# Patient Record
Sex: Female | Born: 1955 | Race: White | Hispanic: No | Marital: Single | Smoking: Former smoker
Health system: Southern US, Community
[De-identification: ages and names within clinical notes are randomized; demographics above are authoritative.]

## PROBLEM LIST (undated history)

## (undated) DIAGNOSIS — I10 Essential (primary) hypertension: Secondary | ICD-10-CM

## (undated) DIAGNOSIS — J449 Chronic obstructive pulmonary disease, unspecified: Secondary | ICD-10-CM

## (undated) DIAGNOSIS — N289 Disorder of kidney and ureter, unspecified: Secondary | ICD-10-CM

## (undated) HISTORY — PX: CHOLECYSTECTOMY: SHX55

## (undated) HISTORY — PX: AV FISTULA PLACEMENT: SHX1204

---

## 2011-03-23 ENCOUNTER — Emergency Department: Payer: Self-pay | Admitting: Emergency Medicine

## 2011-03-24 ENCOUNTER — Emergency Department: Payer: Self-pay | Admitting: Unknown Physician Specialty

## 2011-04-12 ENCOUNTER — Other Ambulatory Visit: Payer: Self-pay | Admitting: Physician Assistant

## 2013-10-25 ENCOUNTER — Emergency Department: Payer: Self-pay | Admitting: Emergency Medicine

## 2013-10-25 LAB — CBC
HCT: 23.2 % — ABNORMAL LOW (ref 35.0–47.0)
HGB: 7.4 g/dL — ABNORMAL LOW (ref 12.0–16.0)
MCH: 31.7 pg (ref 26.0–34.0)
MCHC: 31.8 g/dL — ABNORMAL LOW (ref 32.0–36.0)
MCV: 100 fL (ref 80–100)
Platelet: 232 10*3/uL (ref 150–440)
RBC: 2.33 10*6/uL — ABNORMAL LOW (ref 3.80–5.20)
RDW: 19.5 % — ABNORMAL HIGH (ref 11.5–14.5)
WBC: 5.6 10*3/uL (ref 3.6–11.0)

## 2013-10-25 LAB — COMPREHENSIVE METABOLIC PANEL
ALK PHOS: 86 U/L
AST: 17 U/L (ref 15–37)
Albumin: 3.1 g/dL — ABNORMAL LOW (ref 3.4–5.0)
Anion Gap: 9 (ref 7–16)
BUN: 34 mg/dL — ABNORMAL HIGH (ref 7–18)
Bilirubin,Total: 0.3 mg/dL (ref 0.2–1.0)
CALCIUM: 8.3 mg/dL — AB (ref 8.5–10.1)
Chloride: 93 mmol/L — ABNORMAL LOW (ref 98–107)
Co2: 26 mmol/L (ref 21–32)
Creatinine: 5 mg/dL — ABNORMAL HIGH (ref 0.60–1.30)
EGFR (Non-African Amer.): 9 — ABNORMAL LOW
GFR CALC AF AMER: 10 — AB
GLUCOSE: 106 mg/dL — AB (ref 65–99)
Osmolality: 265 (ref 275–301)
Potassium: 4.7 mmol/L (ref 3.5–5.1)
SGPT (ALT): 10 U/L — ABNORMAL LOW
SODIUM: 128 mmol/L — AB (ref 136–145)
TOTAL PROTEIN: 6.3 g/dL — AB (ref 6.4–8.2)

## 2013-10-25 LAB — TROPONIN I: TROPONIN-I: 0.02 ng/mL

## 2013-10-25 LAB — D-DIMER(ARMC): D-Dimer: 3436 ng/ml

## 2013-10-31 ENCOUNTER — Emergency Department: Payer: Self-pay | Admitting: Emergency Medicine

## 2013-10-31 LAB — CBC
HCT: 22.7 % — AB (ref 35.0–47.0)
HGB: 7.3 g/dL — ABNORMAL LOW (ref 12.0–16.0)
MCH: 31.8 pg (ref 26.0–34.0)
MCHC: 32.3 g/dL (ref 32.0–36.0)
MCV: 98 fL (ref 80–100)
Platelet: 205 10*3/uL (ref 150–440)
RBC: 2.31 10*6/uL — ABNORMAL LOW (ref 3.80–5.20)
RDW: 18.2 % — ABNORMAL HIGH (ref 11.5–14.5)
WBC: 5 10*3/uL (ref 3.6–11.0)

## 2013-10-31 LAB — COMPREHENSIVE METABOLIC PANEL
ALK PHOS: 75 U/L
AST: 21 U/L (ref 15–37)
Albumin: 3.4 g/dL (ref 3.4–5.0)
Anion Gap: 7 (ref 7–16)
BILIRUBIN TOTAL: 0.2 mg/dL (ref 0.2–1.0)
BUN: 39 mg/dL — AB (ref 7–18)
CHLORIDE: 96 mmol/L — AB (ref 98–107)
Calcium, Total: 9.1 mg/dL (ref 8.5–10.1)
Co2: 33 mmol/L — ABNORMAL HIGH (ref 21–32)
Creatinine: 3.71 mg/dL — ABNORMAL HIGH (ref 0.60–1.30)
GFR CALC AF AMER: 15 — AB
GFR CALC NON AF AMER: 13 — AB
GLUCOSE: 93 mg/dL (ref 65–99)
OSMOLALITY: 281 (ref 275–301)
POTASSIUM: 4.5 mmol/L (ref 3.5–5.1)
SGPT (ALT): 12 U/L — ABNORMAL LOW
SODIUM: 136 mmol/L (ref 136–145)
Total Protein: 6.8 g/dL (ref 6.4–8.2)

## 2013-11-22 ENCOUNTER — Emergency Department: Payer: Self-pay | Admitting: Emergency Medicine

## 2013-11-22 LAB — BASIC METABOLIC PANEL
Anion Gap: 9 (ref 7–16)
BUN: 15 mg/dL (ref 7–18)
CHLORIDE: 98 mmol/L (ref 98–107)
Calcium, Total: 8.5 mg/dL (ref 8.5–10.1)
Co2: 31 mmol/L (ref 21–32)
Creatinine: 3.1 mg/dL — ABNORMAL HIGH (ref 0.60–1.30)
EGFR (African American): 18 — ABNORMAL LOW
GFR CALC NON AF AMER: 16 — AB
Glucose: 92 mg/dL (ref 65–99)
Osmolality: 276 (ref 275–301)
Potassium: 3.6 mmol/L (ref 3.5–5.1)
Sodium: 138 mmol/L (ref 136–145)

## 2013-11-22 LAB — CBC WITH DIFFERENTIAL/PLATELET
Basophil #: 0.1 10*3/uL (ref 0.0–0.1)
Basophil %: 1.1 %
Eosinophil #: 0.1 10*3/uL (ref 0.0–0.7)
Eosinophil %: 1.5 %
HCT: 30 % — ABNORMAL LOW (ref 35.0–47.0)
HGB: 10 g/dL — ABNORMAL LOW (ref 12.0–16.0)
LYMPHS ABS: 1.2 10*3/uL (ref 1.0–3.6)
LYMPHS PCT: 25.5 %
MCH: 32.5 pg (ref 26.0–34.0)
MCHC: 33.5 g/dL (ref 32.0–36.0)
MCV: 97 fL (ref 80–100)
Monocyte #: 0.7 x10 3/mm (ref 0.2–0.9)
Monocyte %: 14.7 %
NEUTROS ABS: 2.6 10*3/uL (ref 1.4–6.5)
Neutrophil %: 57.2 %
Platelet: 168 10*3/uL (ref 150–440)
RBC: 3.08 10*6/uL — ABNORMAL LOW (ref 3.80–5.20)
RDW: 16.7 % — AB (ref 11.5–14.5)
WBC: 4.6 10*3/uL (ref 3.6–11.0)

## 2013-11-22 LAB — TROPONIN I: Troponin-I: <0.02 ng/mL

## 2013-11-22 LAB — PRO B NATRIURETIC PEPTIDE: B-TYPE NATIURETIC PEPTID: 121106 pg/mL — AB (ref 0–125)

## 2013-11-26 ENCOUNTER — Inpatient Hospital Stay: Payer: Self-pay | Admitting: Internal Medicine

## 2013-11-26 LAB — CBC
HCT: 29.7 % — ABNORMAL LOW (ref 35.0–47.0)
HGB: 9.9 g/dL — ABNORMAL LOW (ref 12.0–16.0)
MCH: 32.8 pg (ref 26.0–34.0)
MCHC: 33.2 g/dL (ref 32.0–36.0)
MCV: 99 fL (ref 80–100)
Platelet: 185 10*3/uL (ref 150–440)
RBC: 3.01 10*6/uL — ABNORMAL LOW (ref 3.80–5.20)
RDW: 16.1 % — AB (ref 11.5–14.5)
WBC: 5.5 10*3/uL (ref 3.6–11.0)

## 2013-11-26 LAB — COMPREHENSIVE METABOLIC PANEL
ALK PHOS: 70 U/L
ANION GAP: 11 (ref 7–16)
AST: 25 U/L (ref 15–37)
Albumin: 3.8 g/dL (ref 3.4–5.0)
BILIRUBIN TOTAL: 0.5 mg/dL (ref 0.2–1.0)
BUN: 30 mg/dL — AB (ref 7–18)
CALCIUM: 9.2 mg/dL (ref 8.5–10.1)
CHLORIDE: 98 mmol/L (ref 98–107)
CO2: 27 mmol/L (ref 21–32)
CREATININE: 4.72 mg/dL — AB (ref 0.60–1.30)
EGFR (African American): 11 — ABNORMAL LOW
GFR CALC NON AF AMER: 9 — AB
Glucose: 100 mg/dL — ABNORMAL HIGH (ref 65–99)
OSMOLALITY: 278 (ref 275–301)
Potassium: 4.5 mmol/L (ref 3.5–5.1)
SGPT (ALT): 10 U/L — ABNORMAL LOW
SODIUM: 136 mmol/L (ref 136–145)
Total Protein: 7.1 g/dL (ref 6.4–8.2)

## 2013-11-26 LAB — TROPONIN I: Troponin-I: <0.02 ng/mL

## 2013-11-27 LAB — LIPID PANEL
Cholesterol: 119 mg/dL (ref 0–200)
HDL Cholesterol: 45 mg/dL (ref 40–60)
LDL CHOLESTEROL, CALC: 50 mg/dL (ref 0–100)
Triglycerides: 122 mg/dL (ref 0–200)
VLDL Cholesterol, Calc: 24 mg/dL (ref 5–40)

## 2013-11-27 LAB — CBC WITH DIFFERENTIAL/PLATELET
Basophil #: 0.1 10*3/uL (ref 0.0–0.1)
Basophil %: 1.3 %
EOS ABS: 0.2 10*3/uL (ref 0.0–0.7)
EOS PCT: 3.4 %
HCT: 28.2 % — ABNORMAL LOW (ref 35.0–47.0)
HGB: 9.2 g/dL — ABNORMAL LOW (ref 12.0–16.0)
Lymphocyte #: 1.2 10*3/uL (ref 1.0–3.6)
Lymphocyte %: 25.2 %
MCH: 32.2 pg (ref 26.0–34.0)
MCHC: 32.7 g/dL (ref 32.0–36.0)
MCV: 99 fL (ref 80–100)
MONO ABS: 0.4 x10 3/mm (ref 0.2–0.9)
Monocyte %: 8.7 %
Neutrophil #: 2.8 10*3/uL (ref 1.4–6.5)
Neutrophil %: 61.4 %
Platelet: 160 10*3/uL (ref 150–440)
RBC: 2.86 10*6/uL — ABNORMAL LOW (ref 3.80–5.20)
RDW: 15.8 % — AB (ref 11.5–14.5)
WBC: 4.6 10*3/uL (ref 3.6–11.0)

## 2013-11-27 LAB — COMPREHENSIVE METABOLIC PANEL
ALBUMIN: 3.6 g/dL (ref 3.4–5.0)
Alkaline Phosphatase: 67 U/L
Anion Gap: 10 (ref 7–16)
BUN: 39 mg/dL — AB (ref 7–18)
Bilirubin,Total: 0.5 mg/dL (ref 0.2–1.0)
CREATININE: 5.95 mg/dL — AB (ref 0.60–1.30)
Calcium, Total: 9 mg/dL (ref 8.5–10.1)
Chloride: 96 mmol/L — ABNORMAL LOW (ref 98–107)
Co2: 29 mmol/L (ref 21–32)
EGFR (Non-African Amer.): 7 — ABNORMAL LOW
GFR CALC AF AMER: 8 — AB
GLUCOSE: 97 mg/dL (ref 65–99)
Osmolality: 279 (ref 275–301)
Potassium: 4.4 mmol/L (ref 3.5–5.1)
SGOT(AST): 13 U/L — ABNORMAL LOW (ref 15–37)
SGPT (ALT): 8 U/L — ABNORMAL LOW
Sodium: 135 mmol/L — ABNORMAL LOW (ref 136–145)
Total Protein: 6.4 g/dL (ref 6.4–8.2)

## 2013-11-27 LAB — PHOSPHORUS: Phosphorus: 5 mg/dL — ABNORMAL HIGH (ref 2.5–4.9)

## 2013-11-27 LAB — PRO B NATRIURETIC PEPTIDE: B-TYPE NATIURETIC PEPTID: 154666 pg/mL — AB (ref 0–125)

## 2013-11-28 DIAGNOSIS — I359 Nonrheumatic aortic valve disorder, unspecified: Secondary | ICD-10-CM

## 2013-11-28 LAB — BODY FLUID CELL COUNT WITH DIFFERENTIAL
BASOS ABS: 1 %
Eosinophil: 0 %
Lymphocytes: 58 %
NUCLEATED CELL COUNT: 81 /mm3
Neutrophils: 7 %
OTHER CELLS BF: 3 %
OTHER MONONUCLEAR CELLS: 31 %

## 2013-11-28 LAB — PROTIME-INR
INR: 1
PROTHROMBIN TIME: 13.3 s (ref 11.5–14.7)

## 2013-11-28 LAB — GLUCOSE, SEROUS FLUID: GLUCOSE, BODY FLUID: 138 mg/dL

## 2013-11-28 LAB — PROTEIN, BODY FLUID: PROTEIN, BODY FLUID: 3.1 g/dL

## 2013-11-28 LAB — AMYLASE, BODY FLUID: AMYLASE, BODY FLUID: 37 U/L

## 2013-11-28 LAB — CHOLESTEROL, BODY FLUID

## 2013-11-28 LAB — LACTATE DEHYDROGENASE, PLEURAL OR PERITONEAL FLUID: LDH, BODY FLUID: 71 U/L

## 2013-11-29 LAB — PROTIME-INR
INR: 1.1
Prothrombin Time: 13.9 secs (ref 11.5–14.7)

## 2013-11-29 LAB — PHOSPHORUS: PHOSPHORUS: 4.3 mg/dL (ref 2.5–4.9)

## 2013-12-12 ENCOUNTER — Ambulatory Visit: Payer: Self-pay | Admitting: Cardiovascular Disease

## 2013-12-24 ENCOUNTER — Ambulatory Visit: Payer: Self-pay | Admitting: Internal Medicine

## 2014-01-14 ENCOUNTER — Ambulatory Visit: Payer: Self-pay | Admitting: Internal Medicine

## 2014-02-18 ENCOUNTER — Ambulatory Visit: Payer: Self-pay | Admitting: Vascular Surgery

## 2014-02-18 LAB — CBC WITH DIFFERENTIAL/PLATELET
BASOS ABS: 0 10*3/uL (ref 0.0–0.1)
Basophil %: 0.7 %
Eosinophil #: 0.1 10*3/uL (ref 0.0–0.7)
Eosinophil %: 1.3 %
HCT: 34.5 % — ABNORMAL LOW (ref 35.0–47.0)
HGB: 10.9 g/dL — ABNORMAL LOW (ref 12.0–16.0)
Lymphocyte #: 0.8 10*3/uL — ABNORMAL LOW (ref 1.0–3.6)
Lymphocyte %: 12.4 %
MCH: 29.4 pg (ref 26.0–34.0)
MCHC: 31.5 g/dL — ABNORMAL LOW (ref 32.0–36.0)
MCV: 94 fL (ref 80–100)
MONO ABS: 0.6 x10 3/mm (ref 0.2–0.9)
MONOS PCT: 9.4 %
Neutrophil #: 4.9 10*3/uL (ref 1.4–6.5)
Neutrophil %: 76.2 %
PLATELETS: 303 10*3/uL (ref 150–440)
RBC: 3.69 10*6/uL — AB (ref 3.80–5.20)
RDW: 18.3 % — ABNORMAL HIGH (ref 11.5–14.5)
WBC: 6.4 10*3/uL (ref 3.6–11.0)

## 2014-02-18 LAB — POTASSIUM: Potassium: 4.7 mmol/L (ref 3.5–5.1)

## 2014-02-18 LAB — APTT: ACTIVATED PTT: 37.2 s — AB (ref 23.6–35.9)

## 2014-02-18 LAB — PROTIME-INR
INR: 1.1
Prothrombin Time: 14.5 secs (ref 11.5–14.7)

## 2014-02-27 ENCOUNTER — Ambulatory Visit: Payer: Self-pay | Admitting: Vascular Surgery

## 2014-02-27 LAB — POTASSIUM: Potassium: 5.5 mmol/L — ABNORMAL HIGH (ref 3.5–5.1)

## 2014-05-08 ENCOUNTER — Ambulatory Visit: Payer: Self-pay | Admitting: Vascular Surgery

## 2014-05-15 ENCOUNTER — Ambulatory Visit: Payer: Self-pay | Admitting: Vascular Surgery

## 2014-06-14 ENCOUNTER — Emergency Department
Admit: 2014-06-14 | Disposition: A | Discharge status: Other Acute Inpatient Hospital | Payer: Medicare Other | Admitting: Emergency Medicine

## 2014-06-14 LAB — COMPREHENSIVE METABOLIC PANEL
ALBUMIN: 3.8 g/dL
ANION GAP: 12 (ref 7–16)
Alkaline Phosphatase: 78 U/L
BUN: 35 mg/dL — ABNORMAL HIGH
Bilirubin,Total: 0.6 mg/dL
CHLORIDE: 95 mmol/L — AB
CO2: 34 mmol/L — AB
Calcium, Total: 9.7 mg/dL
Creatinine: 4.09 mg/dL — ABNORMAL HIGH
EGFR (Non-African Amer.): 11 — ABNORMAL LOW
GFR CALC AF AMER: 13 — AB
GLUCOSE: 116 mg/dL — AB
Potassium: 5.4 mmol/L — ABNORMAL HIGH
SGOT(AST): 18 U/L
SGPT (ALT): 10 U/L — ABNORMAL LOW
SODIUM: 141 mmol/L
Total Protein: 7.3 g/dL

## 2014-06-14 LAB — TROPONIN I: Troponin-I: 0.03 ng/mL

## 2014-06-14 LAB — CBC
HCT: 26.7 % — ABNORMAL LOW (ref 35.0–47.0)
HGB: 8.5 g/dL — ABNORMAL LOW (ref 12.0–16.0)
MCH: 29 pg (ref 26.0–34.0)
MCHC: 31.9 g/dL — ABNORMAL LOW (ref 32.0–36.0)
MCV: 91 fL (ref 80–100)
PLATELETS: 244 10*3/uL (ref 150–440)
RBC: 2.93 10*6/uL — AB (ref 3.80–5.20)
RDW: 18.2 % — AB (ref 11.5–14.5)
WBC: 6.5 10*3/uL (ref 3.6–11.0)

## 2014-06-14 LAB — CK TOTAL AND CKMB (NOT AT ARMC)
CK, Total: 33 U/L — ABNORMAL LOW
CK-MB: 1 ng/mL

## 2014-06-15 ENCOUNTER — Ambulatory Visit (HOSPITAL_COMMUNITY)
Admission: EM | Admit: 2014-06-15 | Discharge: 2014-06-15 | Disposition: A | Discharge status: Home / Self Care | Payer: Medicare Other | Source: Other Acute Inpatient Hospital | Attending: Internal Medicine | Admitting: Internal Medicine

## 2014-06-15 DIAGNOSIS — I10 Essential (primary) hypertension: Secondary | ICD-10-CM | POA: Diagnosis present

## 2014-06-15 DIAGNOSIS — J9691 Respiratory failure, unspecified with hypoxia: Secondary | ICD-10-CM | POA: Insufficient documentation

## 2014-06-23 ENCOUNTER — Inpatient Hospital Stay
Admit: 2014-06-23 | Disposition: A | Discharge status: Home / Self Care | Payer: Self-pay | Attending: Internal Medicine | Admitting: Internal Medicine

## 2014-06-23 LAB — CBC WITH DIFFERENTIAL/PLATELET
BASOS ABS: 0.1 10*3/uL (ref 0.0–0.1)
Basophil %: 1 %
EOS PCT: 1.3 %
Eosinophil #: 0.1 10*3/uL (ref 0.0–0.7)
HCT: 26.5 % — ABNORMAL LOW (ref 35.0–47.0)
HGB: 8.3 g/dL — ABNORMAL LOW (ref 12.0–16.0)
LYMPHS ABS: 0.7 10*3/uL — AB (ref 1.0–3.6)
Lymphocyte %: 10.8 %
MCH: 29.7 pg (ref 26.0–34.0)
MCHC: 31.2 g/dL — ABNORMAL LOW (ref 32.0–36.0)
MCV: 95 fL (ref 80–100)
Monocyte #: 0.3 x10 3/mm (ref 0.2–0.9)
Monocyte %: 4.7 %
NEUTROS ABS: 5.6 10*3/uL (ref 1.4–6.5)
Neutrophil %: 82.2 %
PLATELETS: 246 10*3/uL (ref 150–440)
RBC: 2.79 10*6/uL — ABNORMAL LOW (ref 3.80–5.20)
RDW: 18.2 % — AB (ref 11.5–14.5)
WBC: 6.8 10*3/uL (ref 3.6–11.0)

## 2014-06-23 LAB — BASIC METABOLIC PANEL
Anion Gap: 14 (ref 7–16)
BUN: 74 mg/dL — ABNORMAL HIGH
CO2: 21 mmol/L — AB
CREATININE: 6.96 mg/dL — AB
Calcium, Total: 9.1 mg/dL
Chloride: 98 mmol/L — ABNORMAL LOW
EGFR (African American): 7 — ABNORMAL LOW
GFR CALC NON AF AMER: 6 — AB
GLUCOSE: 103 mg/dL — AB
POTASSIUM: 6.6 mmol/L — AB
SODIUM: 133 mmol/L — AB

## 2014-06-23 LAB — POTASSIUM: POTASSIUM: 5.4 mmol/L — AB

## 2014-06-23 LAB — PHOSPHORUS: Phosphorus: 5.7 mg/dL — ABNORMAL HIGH

## 2014-06-23 LAB — PRO B NATRIURETIC PEPTIDE: B-Type Natriuretic Peptide: 2725 pg/mL — ABNORMAL HIGH

## 2014-06-24 LAB — CBC WITH DIFFERENTIAL/PLATELET
Basophil #: 0.1 10*3/uL (ref 0.0–0.1)
Basophil %: 1.2 %
Eosinophil #: 0.1 10*3/uL (ref 0.0–0.7)
Eosinophil %: 2.4 %
HCT: 26.9 % — AB (ref 35.0–47.0)
HGB: 8.6 g/dL — AB (ref 12.0–16.0)
Lymphocyte #: 0.9 10*3/uL — ABNORMAL LOW (ref 1.0–3.6)
Lymphocyte %: 17.4 %
MCH: 29.1 pg (ref 26.0–34.0)
MCHC: 31.9 g/dL — AB (ref 32.0–36.0)
MCV: 91 fL (ref 80–100)
MONO ABS: 0.5 x10 3/mm (ref 0.2–0.9)
MONOS PCT: 10.5 %
Neutrophil #: 3.5 10*3/uL (ref 1.4–6.5)
Neutrophil %: 68.5 %
PLATELETS: 247 10*3/uL (ref 150–440)
RBC: 2.95 10*6/uL — AB (ref 3.80–5.20)
RDW: 17.8 % — ABNORMAL HIGH (ref 11.5–14.5)
WBC: 5.1 10*3/uL (ref 3.6–11.0)

## 2014-06-24 LAB — BASIC METABOLIC PANEL
Anion Gap: 10 (ref 7–16)
BUN: 35 mg/dL — ABNORMAL HIGH
CALCIUM: 8.4 mg/dL — AB
Chloride: 95 mmol/L — ABNORMAL LOW
Co2: 31 mmol/L
Creatinine: 3.78 mg/dL — ABNORMAL HIGH
EGFR (African American): 14 — ABNORMAL LOW
EGFR (Non-African Amer.): 12 — ABNORMAL LOW
GLUCOSE: 100 mg/dL — AB
POTASSIUM: 4.7 mmol/L
Sodium: 136 mmol/L

## 2014-06-26 ENCOUNTER — Ambulatory Visit
Admit: 2014-06-26 | Disposition: A | Discharge status: Home / Self Care | Payer: Self-pay | Attending: Vascular Surgery | Admitting: Vascular Surgery

## 2014-07-05 NOTE — H&P (Signed)
PATIENT NAME:  Katie Larsen, Katie Larsen MR#:  [id number] DATE OF BIRTH:  07-19-1955  DATE OF ADMISSION:  11/26/2013  PRIMARY CARE PHYSICIAN: Katie GarrisonStephen Kizer, MD  REFERRING PHYSICIAN: Daryel NovemberJonathan Williams, MD   CHIEF COMPLAINT: Shortness of breath and worsening of lower extremity swelling.   HISTORY OF PRESENT ILLNESS: The patient is a 59 year old pleasant Caucasian female with a past medical history of end-stage renal disease on hemodialysis on Monday, Wednesday and Friday, is presenting to the ED with a chief complaint of shortness of breath and worsening of lower extremity swelling since yesterday evening. The patient is compliant with her dialysis and her last dialysis was yesterday. The patient just recently had a cardiac arrest [place], on [place]th. The patient was [facility] and she was in the hospital for 2 weeks and she was subsequently sent over to the rehabilitation center. Recently, she was released from the rehabilitation and currently living with her dad. As the patient is significantly volume loaded, the ER physician called on-call nephrology, Dr. Thedore Larsen, and notified about the patient's fluid overload. He is aware of the patient's situation. The plan is to get hemodialysis done as soon as possible tomorrow morning. During my examination, the patient is in tears from low back pain and also rib pain. The patient is reporting that when she had cardiac arrest 1 month ago CPR was done and she had multiple risk factors. She is requesting for pain medicine. Dad is at bedside. No other complaints.   PAST MEDICAL HISTORY: End-stage renal disease on hemodialysis on Monday, Wednesday and Friday. Recent history of cardiac arrest, medically managed. Hypertension, anxiety.   PAST SURGICAL HISTORY: Left AV fistula.  ALLERGIES: No known drug allergies.   PSYCHOSOCIAL HISTORY: Lives at home with her father. Denies any history of smoking, alcohol or illicit drug usage.    FAMILY HISTORY: Hypertension runs in her family, also she is reporting cardiac problems.   REVIEW OF SYSTEMS: CONSTITUTIONAL: Denies any fever or fatigue.  EYES: Denies blurry vision, double vision, glaucoma.  EARS, NOSE AND THROAT: Denies epistaxis, discharge, snoring.  RESPIRATORY: Denies cough, COPD. CARDIOVASCULAR: Complaining of diffuse chest pain in the rib area from CPR, which was done recently during her cardiac arrest. Denies any syncope, palpitations.  GASTROINTESTINAL: No nausea, vomiting, diarrhea, abdominal pain.  GENITOURINARY: No dysuria, hematuria or renal calculi.  ENDOCRINE: Denies polyuria, nocturia, thyroid problems. GYNECOLOGICAL AND BREASTS: Denies breast mass or vaginal discharge.  Hematologic And Lymphatic: No anemia, easy bruising or bleeding.  INTEGUMENTARY: No acne, rash, lesions.  MUSCULOSKELETAL: Complaining of low back pain and pain in the chest from the recent rib fractures.  NEUROLOGICAL: Denies any vertigo, ataxia, dementia.  PSYCHIATRIC: No BPD or Schizophrenia  PHYSICAL EXAMINATION:  VITAL SIGNS: Temperature 98.2, pulse 84, respirations 16 to 18, blood pressure 172/95, pulse oximetry 94% on 2 liters, but during my examination her pulse oximetry dropped down to 91%, so oxygen increased to 3 liters and the patient saturations went back to 93% to 94%.  GENERAL APPEARANCE: Not in acute distress. Moderately built and thin-looking female, not in acute distress, but uncomfortable from back pain  HEENT: Normocephalic, atraumatic. Pupils are equally reacting to light and accommodation. No scleral icterus. No conjunctival injection. No sinus tenderness. No postnasal drip. Moist mucous membranes.  NECK: Supple. No JVD. Range of motion is intact. Trachea is midline.  LUNGS: Minimal rales and rhonchi are present bilaterally diffusely, but no accessory muscle usage. Reproducible anterior chest wall  tenderness, especially in the midsternal area.  GASTROINTESTINAL:  Soft. Bowel sounds are positive in all 4 quadrants. Nontender, nondistended. No hepatosplenomegaly. No masses felt.  NEUROLOGIC: Awake, alert, oriented x 3. Cranial nerves II-XII are grossly intact. Motor and sensory are intact. Reflexes are 2+.  EXTREMITIES: Pitting edema 3+ is present to the lower extremities, which is pitting in nature. No cyanosis. No clubbing. Examination of the back, no CVA tenderness.  PSYCHIATRIC: The patient is very anxiety and in tears from pain in the back and rib pain.   LABORATORIES AND IMAGING STUDIES: Chest x-ray, PA and lateral views: Process of volume overload with right greater than left-sided pleural effusion. Thoracic spine, AP and lateral view: No acute fracture or subluxation. Increased loculated fluid in the left major fissure. Bilateral small pleural effusions. Stable compression deformity is within the thoracic spine. Twelve-lead EKG: Normal sinus rhythm at 79 beats per minute with premature atrial contractions. Left atrial hypertrophy is present and the patient has a prolonged QT.   ASSESSMENT AND PLAN: A 59 year old pleasant Caucasian female with a history of end-stage renal disease on hemodialysis on Monday, Wednesday and Friday is coming to the ED with a chief complaint of shortness of breath as well as worsening of her lower extremity edema since yesterday evening. The patient is also complaining of pain in her back and in the ribs. The patient is compliant with her dialysis and her last dialysis was yesterday. ER physician has notified Dr. Thedore Mins, the on-call nephrology, regarding the patient's volume overload. He is aware and he is considering to do hemodialysis first thing in the morning.  1.  Acute respiratory distress from volume overload and pulmonary congestion. We will admit her to telemetry. We will consider giving a small dose of Lasix IV, as the patient still makes some urine and requesting to give her Lasix. We will continue oxygen via nasal cannula  and if necessary we will put her on BiPAP. Nephrology consult is placed to Dr. Thedore Mins and notified by Emergency Room physician regarding hemodialysis as soon as possible.  2.  Malignant hypertension, probably volume overload as well as from anxiety and pain. We will home medications and we will provide her hydralazine 5 mg IV as needed basis for systolic blood pressure greater than 165. 3.  End-stage renal disease. The patient gets hemodialysis on Monday, Wednesday and Friday. We will continue the same, but the patient might need dialysis as soon as possible regarding the volume overload regarding nephrologist consult.  4.  Recent history of cardiac arrest, status post cardiopulmonary resuscitation on July 30th, complaining of rib pain from the rib fractures from cardiopulmonary resuscitation. We will continue her home medication aspirin, beta blocker and statin and also will continue her home medications for pain and also will provide morphine IV as needed basis for severe pain. 5.  Anxiety. We will resume anxiolytics, which is from her home. 6.  We will provide gastrointestinal prophylaxis with Pepcid and deep veinous thrombosis prophylaxis with heparin subcutaneous.   CODE STATUS: She is full code. Dad is medical power of attorney.   Plan of care was discussed in detail with the patient and her dad at bedside. They both verbalized understanding of the plan.   TOTAL TIME SPENT: Fifty minutes.   ____________________________ Ramonita Lab, MD ag:TT D: 11/26/2013 18:02:50 ET T: 11/26/2013 18:59:31 ET JOB#: [id number]  cc: Ramonita Lab, MD, <Dictator> Mosetta Pigeon, MD Katie Garrison, MD  Ramonita Lab MD ELECTRONICALLY SIGNED 12/03/2013 13:34

## 2014-07-05 NOTE — Discharge Summary (Signed)
PATIENT NAME:  Katie Larsen, Katie Larsen MR#:  [id number] DATE OF BIRTH:  10-07-1955  DATE OF ADMISSION:  11/26/2013 DATE OF DISCHARGE:  11/29/2013  CHIEF COMPLAINT AT THE TIME OF ADMISSION: Shortness of breath and worsening of lower extremity swelling.   ADMITTING DIAGNOSES: 1.  Acute respiratory distress from volume overload and pulmonary congestion.  2.  Malignant hypertension.  3.  End-stage renal disease.  4.  Recent history of cardiac arrest status post cardiopulmonary resuscitation with rib fractures.  5.  Anxiety.   DISCHARGE DIAGNOSES: 1.  Acute hypoxic respiratory failure from underlying pleural effusion and chronic obstructive pulmonary disease.  2.  Acute on chronic systolic congestive heart failure with right greater than left-sided pleural effusion status post thoracocentesis and fluid extraction on 11/28/2013.  3.  End-stage renal disease, on hemodialysis.  4.  Accelerated hypertension.   SECONDARY DISCHARGE DIAGNOSES:  1.  History of coronary artery disease status post recent cardiac arrest.  2.  Chronic anterior chest wall tenderness from recent CPR during cardiac arrest which has lead to rib fractures.  CONSULTATIONS: Nephrology.  PROCEDURES: CT-guided thoracocentesis on September 17th.   HISTORY AND REASON FOR ADMISSION: The patient is a 59 year old Caucasian female who came into the ED with the chief complaint of worsening of shortness of breath and lower extremity edema. Please review my history and physical dictated on 11/26/2013. The patient was admitted to the hospital with the chief complaint of acute hypoxic respiratory failure from fluid overload and pulmonary congestion and mild malignant hypertension.   HOSPITAL COURSE: 1.  Acute respiratory distress from volume overload and pulmonary congestion resolved after hemodialysis. Nephrology was consulted who has recommended hemodialysis as soon as possible. While waiting for hemodialysis, as the patient makes urine, a small  dose of Lasix IV was given. Subsequently, the patient started feeling better. The chest x-ray has revealed pulmonary edema, greater on the right than on the left side. The patient stated that she had approximately 4 to 5 times of thoracentesis in the past for recurrent pleural effusion. As the patient was still having some baseline shortness of breath CT guided thoracentesis was done on 09/17 and fluid was collected and sent over for fluid analysis, which is pending at this time. Echocardiogram has revealed 60% to 65% of ejection fraction. Repeat chest x-ray did not reveal any pneumothorax following thoracentesis. The patient tolerated the procedure well and was clinically stable.  2.  For accelerated hypertension, the patient's home medications were continued, hemodialysis was continued and no new medications were added. The medication needs to be titrated by the primary care physician on an outpatient basis for better control of the blood pressure.  3.  Acute on chronic systolic congestive heart failure with right greater than left-sided pleural effusion. Thoracentesis was done. The decompensation seemed to be from accelerated hypertension. With better control of the blood pressure and hemodialysis her shortness of breath significantly improved. Lower extremity edema was also resolved. The patient started feeling better.   4.  The patient has acute hypoxic respiratory failure. Home oxygen was added as the patient was desaturating. The patient was saturating at 90 to 91% on 3 liters of oxygen with ambulation.  5.  The patient's chronic chest wall tenderness was assumed to be from recent CPR done during cardiac arrest which lead to rib fractures. The plan is to continue Percocet on as needed basis.   Hemodialysis was continued during the hospital course and the plan is to continue hemodialysis on Monday, Wednesday and Friday  as an outpatient nephrology with [facility] nephrology. The patient's clinical situation  significantly improved.   DISCHARGE INSTRUCTIONS: Case management was consulted regarding 3 liters of home O2 arrangements. The patient is to follow up with primary care physician in a week, [facility] nephrology, Dr. Wilford Corner, in a week. Continue hemodialysis on Monday, Wednesday and Friday. Follow up with cardiology, Dr. Jari Sportsman group.   Thoracentesis pleural fluid results are pending, which need to be followed up by the primary care physician.   The patient has to follow up with the CHF clinic.   ACTIVITY: As tolerated.   DIET: Renal diet.   DISPOSITION: Home with home oxygen.  DISCHARGE MEDICATIONS: Clonidine 0.1 mg 1 tablet p.o. 2 times a day, atorvastatin 80 mg p.o. once daily at bedtime, Coreg 12.5 mg 2 tablets p.o. 2 times a day, aspirin 81 mg once a day, hydralazine 100 mg p.o. every 8 hours, Keppra 500 mg 1/2 tablet p.o. once a day at bedtime, lorazepam 0.5 mg 1 tablet p.o. 3 times a day as needed for anxiety and nervousness, Tylenol 500 mg 2 tablets every 6 hours as needed for pain, Percocet 325/5 one tablet p.o. every 4 hours as needed for pain, amlodipine 10 mg once daily, furosemide 80 mg once daily, losartan 100 mg p.o. once a day.   The patient has to limit her fluid intake to less than 1500 mL. Plan of care was discussed in detail with the patient. She verbalized understanding of the plan.   TOTAL TIME SPENT ON THE DISCHARGE: 45 minutes.  ____________________________ Ramonita Lab, MD ag:sb D: 12/03/2013 13:50:04 ET T: 12/03/2013 14:17:28 ET JOB#: [id number]  cc: Ramonita Lab, MD, <Dictator> Ramonita Lab MD ELECTRONICALLY SIGNED 12/11/2013 16:23

## 2014-07-07 LAB — SURGICAL PATHOLOGY

## 2014-07-09 NOTE — Op Note (Signed)
PATIENT NAME:  Katie Larsen, Airis M MR#:  [id number] DATE OF BIRTH:  06/02/1955  DATE OF PROCEDURE:  02/27/2014  PREOPERATIVE DIAGNOSES:  1.  End-stage renal disease.  2.  Non-usable left brachiobasilic arteriovenous fistula.  3.  Hypertension.   POSTOPERATIVE DIAGNOSES:  1.  End-stage renal disease.  2.  Non-usable left brachiobasilic arteriovenous fistula.  3.  Hypertension.   PROCEDURES:  Revision/transposition of left brachiobasilic arteriovenous fistula.   SURGEON: Annice NeedyJason S. Calysta Craigo, MD   ANESTHESIA: General.   ESTIMATED BLOOD LOSS: 25 mL.   INDICATION FOR PROCEDURE: This is an individual with end-stage renal disease. She had a first stage of a brachiobasilic AV fistula performed at another hospital several months ago. This is not in a usable location and has not been transposed. We are trying to revise this to make this a useful fistula so that she can begin dialysis with that and remove her catheter. Risks and benefits were discussed. Informed consent was obtained.   DESCRIPTION OF THE PROCEDURE: The patient is brought to the operative suite and after an adequate level of general anesthesia was obtained, the left upper extremity was sterilely prepped and draped and a sterile surgical field was created. An incision was made overlying the previous incision in the antecubital fossa and the basilic vein was dissected out. This was dissected down to its anastomosis to the artery and then the incision was gradually elongated up to the axilla to where the basilic vein emptied into the axillary vein. There were a large number of branches that were ligated and divided between silk ties, and any large branches were doubly ligated. The vein was marked for orientation. I then used a large curved Kelly to tunnel from the antecubital spot to the axillary spot and bring the vein. The vein was then transected after being clamped at the anastomosis with a vascular clamp, and the patient heparinized. It was brought  in through the tunnel. The vein was actually somewhat redundant and the area that the vein was previously transected was somewhat sclerotic and stenotic. I used dilators and was able to get up to a 4 dilator, but the vein really would not dilate beyond this. As we had a little extra length on the vein, I transected about 1 cm to 1.5 cm of vein, bringing this to a more pliable and soft portion of the vein where a 5 dilator easily passed. The anastomosis was then created in an end-to-end fashion with two 6-0 Prolene sutures in a parachuting fashion. The vessel was flushed and deaired prior to releasing control. On release, there was excellent thrill within the AV fistula. The wound was irrigated. Surgicel and Evicel topical hemostatic agents were placed. Hemostasis was complete. The incision was closed with a series of interrupted 3-0 Vicryl sutures. A 3-0 Vicryl running suture was used to close the subcutaneous space and a 4-0 Monocryl was used to close the skin. Dermabond was placed as a dressing. The patient was awakened from anesthesia and taken to the recovery room in stable condition, having tolerated the procedure well.   ____________________________ Annice NeedyJason S. Javid Kemler, MD jsd:ST D: 02/27/2014 15:02:20 ET T: 02/28/2014 01:51:44 ET JOB#: [id number]  cc: Annice NeedyJason S. Lydie Stammen, MD, <Dictator> Annice NeedyJASON S Eulamae Greenstein MD ELECTRONICALLY SIGNED 03/17/2014 14:10

## 2014-07-13 NOTE — H&P (Signed)
[name], Katie Larsen MR#:  [id number] DATE OF BIRTH:  1956-01-25  DATE OF ADMISSION:  06/23/2014  REFERRING EMERGENCY ROOM Larsen: Katie Breeze, MD   Katie Larsen:  Katie Larsen: Shortness of breath.   HISTORY OF PRESENT ILLNESS: Katie very pleasant 59 year old Larsen with past medical history of end-stage renal disease on hemodialysis, coronary artery disease, diastolic dysfunction, history of cardiac arrest in July 2015j, chronic respiratory failure on 2-3 liters of nasal cannula at home, presents today with acute worsening of shortness of breath. Katie Larsen reports that Katie Larsen symptoms started abruptly last night when Katie Larsen laid down to go to bed. Katie Larsen found that Katie Larsen could not breathe. Katie Larsen is usually sleeps on 2 pillows, but was unable to get comfortable last night and had to sleep sitting up. Katie Larsen denies any fevers or chills. No nausea or vomiting, no sputum production or hemoptysis. Katie Larsen was supposed to have hemodialysis today, but Katie Larsen came to [facility] via EMS instead, due to shortness of breath. Katie Larsen has complains of a left rib and upper abdominal pain, which has been present for approximately 1 year. [name], Katie Larsen was found to be hyperkalemic with a potassium of 6.6. Chest x-ray showing interstitial edema, suggestive of volume overload.   PAST MEDICAL HISTORY: 1. End-stage renal disease on hemodialysis on Monday, Wednesdays, and Fridays for Katie past 1 year.  2. Hypertension.  3. Anxiety.  4. Coronary artery disease, status post cardiac arrest in July 2015.  5. Diastolic dysfunction seen on 2-D echocardiogram, September 2015.  6. Pulmonary hypertension, echocardiogram, September 2015.  7. Multiple thoracic compression fractures seen on recent imaging.  8. Chronic respiratory failure, on 2-3 liters of oxygen at home.  9. Emphysematous changes on imaging, probable COPD.   SOCIAL HISTORY: Katie Larsen lives with Katie Larsen father, who is ill and  does not help with Katie Larsen care. Katie Larsen does not use a cane or a walker. Katie Larsen uses 2-3 liters of nasal cannula daily. Katie Larsen reports that Katie Larsen is a former smoker; but was never a heavy smoker. Denies any alcohol or illicit substance abuse.   FAMILY MEDICAL HISTORY: Positive for hypertension and coronary artery disease.   REVIEW OF SYSTEMS: CONSTITUTIONAL: No fever, fatigue, or change in weight.  HEENT: No change in vision or hearing. No pain in Katie eyes or ears. No sinus congestion or difficulty swallowing.  RESPIRATORY: Positive for shortness of breath with exertion, painful respiration, probable COPD, no recent coughing or wheezing.  CARDIOVASCULAR: No chest pain or edema, Katie Larsen does have orthopnea, no palpitations or syncope.  GASTROINTESTINAL: No nausea, vomiting, diarrhea, abdominal pain, or change in bowel habits.  GENITOURINARY: No dysuria or frequency.  ENDOCRINE: No polyuria, polydipsia, hot or cold intolerance.  HEMATOLOGIC: No easy bruising, bleeding, or swollen lymph glands.  SKIN: No recent changes. No rash, lesions, or open wounds.  MUSCULOSKELETAL: No new pain in Katie neck, back, shoulders, knees, or hips. Katie Larsen has a chronic back pain and also, for Katie past year, has had pain in a band sensation across Katie ribs, worse on Katie left side.  NEUROLOGIC: No focal numbness or weakness. No history of CVA, dementia or seizure.  PSYCHIATRIC: No bipolar disorder or schizophrenia, positive for anxiety.   PAST SURGICAL HISTORY: Left AV fistula placement.   HOME MEDICATIONS: 1. Tramadol 50 mg 1 tablet every 4 hours as needed for pain.  2. PhosLo 667 mg 3 capsules 3 times a day with meals, 1 capsule twice a  day with snacks. 3. Lisinopril 40 mg 1/2 tablet once a day.  4. Hydroxyzine 25 mg 1 tablet 4 times a day as needed for itching.  5. Hydralazine 100 mg 1 tablet twice a day.  6. Carvedilol 25 mg 1 tablet twice a day.  7. Aspirin 81 mg daily.   ALLERGIES: HYDROCODONE CAUSES ITCHING.   PHYSICAL  EXAMINATION: VITAL SIGNS: Temperature 92.2, pulse 67, respirations 20, blood pressure 193/90, oxygenation 98% on 3.5 liters nasal cannula.  GENERAL: Katie Larsen is anxious and dyspneic sitting forward.  HEENT: Pupils equal, round, and reactive to light. Conjunctivae clear, extraocular motion intact. Oral mucous membranes are pink and moist. Posterior oropharynx is clear. No exudate, edema, or erythema.  NECK: Supple, trachea midline. No cervical lymphadenopathy.  RESPIRATORY: Katie Larsen is taking short shallow respirations Katie Larsen has bibasilar crackles and decreased breath sounds on Katie right base. No wheezing or rhonchi. No coughing during examination.  CARDIOVASCULAR: Regular rate and rhythm. There is a 3/6 systolic ejection murmur. No peripheral edema. Peripheral pulses 1+. Katie Larsen does have JVD. No carotid bruit.  ABDOMEN: Soft, nondistended. There is mild tenderness in Katie epigastric area. No guarding, no rebound.  MUSCULOSKELETAL: No joint effusions. Range of motion normal. Strength and tone equal bilaterally and stable.  SKIN: No skin rashes, lesions, or open wounds; particularly, no skin changes over Katie left ribs or upper abdomen.  NEUROLOGIC: Cranial nerves II through XII grossly intact. Strength and sensation intact.  PSYCHIATRIC: Katie Larsen is very anxious. Katie Larsen is jittery, moving around in Katie bed. Katie Larsen is alert and oriented and does seem to have good insight into Katie Larsen.   LABORATORY DATA: Sodium 133, potassium 6.6, chloride 98, bicarbonate 21, BUN 74, creatinine 6.96, glucose 103, calcium 9.1. White blood cells 6.8, hemoglobin 8.3, platelets 246,000. MCV is 95.   IMAGING: Chest x-ray shows persistent congestive heart failure superimposed on emphysematous change. There is a partially loculated effusion on Katie right with atelectatic changes in Katie right base, which is stable.   ASSESSMENT AND PLAN: 1. Acute on chronic respiratory failure: Katie is likely due to volume overload in Katie setting of  dietary noncompliance in a hemodialysis Larsen. Katie Larsen does have signs of congestive heart failure exacerbation due to diastolic dysfunction volume overload on Katie Larsen chest x-ray. Katie Larsen will have dialysis today to reduce Katie Larsen volume load. I am also concerned about Katie Larsen potential emphysematous changes and Katie stable loculated effusion, which was evaluated during Katie last admission. Katie Larsen has been persistently hypoxic since that admission when home oxygen was started in September 2015. I will also ask for a pulmonology consult. We will start scheduled nebulizer treatments. Reassess after hemodialysis.  2. Hyperkalemia with EKG changes of T waves: Katie Larsen will have hemodialysis today. Katie Larsen has already received calcium gluconate, insulin, and Lasix in [facility]. I have discussed with nephrology, and they are in favor of continuing lisinopril. 3. Anxiety: Katie Larsen is very anxious at Katie time, likely due to hypoxia, but also probably with some chronic anxiety disorder. Would suggest starting an selective serotonin reuptake inhibitor for Katie Larsen. Katie Larsen likely also needs counseling and support. I will order a social work consult.  4. Abdominal pain: Katie Larsen reports that Katie Larsen has had CT scans and further evaluation for Katie pain. Katie Larsen has reports that Katie Larsen has multiple thoracic compression fractions, which I can see on radiology, as well. I suspect that Katie Larsen has radiculopathy from these compression fractures. Katie Larsen may need to discuss with Katie Larsen Katie Larsen, referral to a  spine center for contractions, or other treatment for Katie radiculopathy.  5. Katie Larsen is a DO NOT RESUSCITATE. I discussed Katie with Katie Larsen. Katie Larsen has been through cardiopulmonary resuscitation and states that Katie Larsen would never want to go through that again. I have placed Katie disorder in Katie Larsen chart.   TIME SPENT ON ADMISSION: 45 minutes.     ____________________________ Ena Dawleyatherine P. Clent RidgesWalsh, MD cpw:mw D: 06/23/2014 17:05:14 ET T: 06/23/2014  17:37:47 ET JOB#: [id number]  cc: Santina Evansatherine P. Clent RidgesWalsh, MD, <Dictator> Gale JourneyATHERINE P WALSH MD ELECTRONICALLY SIGNED 07/01/2014 15:08

## 2014-07-13 NOTE — Discharge Summary (Signed)
PATIENT NAME:  Katie Larsen, Katie Larsen MR#:  [id number] DATE OF BIRTH:  1955-04-03  DATE OF ADMISSION:  06/23/2014 DATE OF DISCHARGE:  06/24/2014  For a detailed note, please take a look at the history and physical done on admission by Dr. Ena Dawleyatherine P. Clent RidgesWalsh, MD.   DIAGNOSES AT DISCHARGE: Is as follows: 1. Acute respiratory failure with hypoxia secondary to volume overload and congestive heart failure.  2. Congestive heart failure.  3. Acute and chronic diastolic dysfunction.  4. End-stage renal disease on hemodialysis.  5. Hyperkalemia.  6. Hypertension.  7. Secondary hyperparathyroidism.  8. Chronic obstructive pulmonary disease with a loculated pleural effusion.   DIET:   The patient is being discharged home on a low sodium renal diet.   ACTIVITY: As tolerated.   FOLLOW-UP:  Dr. Mellody DanceKizer in the next 1-2 weeks.  Also, follow up with Dr. Meredeth IdeFleming in the next 1-2 weeks.   DISCHARGE MEDICATIONS: Are as follows: Coreg 25 mg b.i.d., lisinopril 40 mg 1/2 tablet daily, hydralazine 100 mg b.i.d., aspirin 81 mg daily, PhosLo 667 mg 1 tab b.i.d., tramadol 50 mg every 4 hours as needed, hydroxyzine 25 mg 4 times daily as needed PhosLo 3 tabs t.i.d. with meals.   CONSULTANTS DURING THE HOSPITAL COURSE: Lennox PippinsMunsoor N. Lateef, MD, from nephrology.   PERTINENT STUDIES DONE DURING HOSPITAL COURSE: A chest x-ray done on April 11 showing persistent CHF superimposed on emphysema, partially loculated effusion on the right with atelectatic change in the right base.   HOSPITAL COURSE: This is a 59 year old female with medical problems as mentioned above, presented to the hospital with shortness of breath and noted to be in acute respiratory failure.  1. Acute respiratory failure with hypoxia. This was likely secondary to volume overload and congestive heart failure. The patient underwent hemodialysis urgently given her history of end-stage renal disease and her symptoms have significantly improved postdialysis. The patient  also has underlying history of COPD and is already on oxygen at home. After being dialyzed; however, clinical symptoms and hypoxia have significantly improved and, therefore, she is being discharged home.  2. Hyperkalemia. This was likely secondary to the end-stage renal disease. The patient was dialyzed emergently and her potassium has since then improved and normalized. She had no evidence of any acute arrhythmia on telemetry.  3. End-stage renal disease on hemodialysis. The [facility]. She will maintain  her schedule of Monday, Wednesday, Friday dialysis.  4. COPD with a loculated pleural effusion. The patient is already on oxygen at home.  She did not have any evidence of pleurisy or any fever or any elevated white cell count to suggest that the effusion was a pneumonic or parapneumonic effusion. I did set up an appointment with Dr. Meredeth IdeFleming for her to see as an outpatient.  5. Secondary hyperparathyroidism.  The patient was maintained on her calcium acetate.   She will  resume that.  6. Hypertension. The patient was maintained on her Coreg, hydralazine, and lisinopril and she will resume that upon discharge.   CODE STATUS: THE PATIENT IS A FULL CODE.   TIME SPENT ON DISCHARGE: Is 35 minutes.    ____________________________ Rolly PancakeVivek J. Cherlynn KaiserSainani, MD vjs:tr D: 06/24/2014 16:02:14 ET T: 06/24/2014 21:30:22 ET JOB#: [id number]  cc: Rolly PancakeVivek J. Cherlynn KaiserSainani, MD, <Dictator> Herbon E. Meredeth IdeFleming, MD Dr. Kandy GarrisonStephen Kizer    Houston SirenVIVEK J SAINANI MD ELECTRONICALLY SIGNED 07/02/2014 11:28

## 2014-07-13 NOTE — Op Note (Signed)
PATIENT NAME:  Katie Larsen, Katie Larsen MR#:  [id number] DATE OF BIRTH:  Aug 23, 1955  DATE OF PROCEDURE:  06/26/2014  PREOPERATIVE DIAGNOSES:  1.  End-stage renal disease.  2.  Slowly maturing left arm arteriovenous fistula.  3.  Prolonged bleeding with dialysis.  4.  Hypertension.   POSTOPERATIVE DIAGNOSES:  1.  End-stage renal disease.  2.  Slowly maturing left arm arteriovenous fistula.  3.  Prolonged bleeding with dialysis.  4.  Hypertension.   PROCEDURES:  1.  Ultrasound guidance for vascular access to left arm arteriovenous fistula.  2.  Left upper extremity fistulogram and central venogram.   SURGEON: Annice NeedyJason S Cathi Hazan, MD   ANESTHESIA: Local with moderate conscious sedation.   ESTIMATED BLOOD LOSS: Minimal.   FLUOROSCOPY TIME: Less than 1 minute at 15 mL, contrasts were used.   INDICATION FOR PROCEDURE: A 59 year old female with end-stage renal disease. She has been catheter dependent for about a year. She had a brachiobasilic AV fistula performed at outside institution which I transposed about 4 months ago.  It had just been starting to be used in the last few weeks and has had prolonged bleeding.  It was a slow to mature.  A previous noninvasive study had suggested stenosis of the arterial anastomosis several weeks ago. She is brought in for evaluation with a fistulogram.  Risks and benefits were discussed. Informed consent was obtained.   DESCRIPTION OF PROCEDURE: The patient is brought to angiography suite. The left upper extremity was sterilely prepped and draped and a sterile surgical field was created. The fistula was accessed near the midportion of the access site in the upper arm with a brachiobasilic AV fistula.  This was done under direct ultrasound guidance without difficulty with a micropuncture needle, and micropuncture wire and sheath were placed.  Imaging was performed through the micropuncture sheath, including compression of the fistula, to evaluate the arterial anastomosis and  brachial artery. The fistula was widely patent. There was no significant stenosis of more than 15% or 20% in any location.  The site of the vein-to-vein anastomosis from the transposition was patent.  Again with at most a 15% stenosis.  The outflow of the basilic axillary and then central veins were all widely patent. At this point, I elected to terminate the procedure. The sheath was removed, 4-0 Monocryl pursestring suture was placed. Pressure was held. Sterile dressing was placed. The patient tolerated the procedure well.       ____________________________ Annice NeedyJason S. Trajon Rosete, MD jsd:DT D: 06/26/2014 12:08:40 ET T: 06/26/2014 14:44:22 ET JOB#: [id number]  cc: Annice NeedyJason S. Marigene Erler, MD, <Dictator> Annice NeedyJASON S Macoy Rodwell MD ELECTRONICALLY SIGNED 07/09/2014 13:47

## 2014-07-13 NOTE — Op Note (Signed)
PATIENT NAME:  Katie Larsen, Katie Larsen MR#:  [id number] DATE OF BIRTH:  08-31-1955  DATE OF PROCEDURE:  05/15/2014  PREOPERATIVE DIAGNOSES:  1.  Large cyst on back, symptomatic.  2.  End-stage renal disease.   POSTOPERATIVE DIAGNOSES:  1.  Large cyst on back, symptomatic.  2.  End-stage renal disease.   PROCEDURE:  Excision of sebaceous cyst from back.   SURGEON:  Festus BarrenJason Camiah Humm, MD.   ANESTHESIA: MAC.   ESTIMATED BLOOD LOSS: Minimal.   INDICATION FOR PROCEDURE: A 59 year old female who is known to me for her dialysis access, however she has a large symptomatic cyst on her back.  This is just to the right of the midline towards her scapula.  It is about 5-6 cm in longest dimension and 3-4 cm in width. She desires to have this removed, it causes local symptoms of pain and irritation. Risks and benefits were discussed. Informed consent was obtained.   DESCRIPTION OF PROCEDURE: The patient was brought to the operative suite. She was placed in prone position and started on intravenous sedation. The area was prepped and draped, and was locally anesthetized with a mixture of 1% lidocaine and 0.25% Marcaine using about 20 mL of local anesthetic. An elliptical incision was created and we dissected out what appeared to be a large sebaceous cyst. Again it was probably 5-6 cm in longest width and about 4 cm in its width, being about 20-25 cm total area. The cyst was removed in its entirety and sent off for pathology.  The wound was then copiously irrigated. All bleeding was controlled with electrocautery. The wound was closed with 3-0 Vicryl and a series of 3-0 nylon mattress sutures in the skin. Sterile dressing was placed. The patient tolerated the procedure well and was taken to the recovery room in stable condition.    ____________________________ Annice NeedyJason S. Devaris Quirk, MD jsd:bu D: 05/15/2014 13:55:02 ET T: 05/15/2014 21:25:29 ET JOB#: [id number]  cc: Annice NeedyJason S. Isley Weisheit, MD, <Dictator> Annice NeedyJASON S Staria Birkhead MD ELECTRONICALLY SIGNED  05/22/2014 10:37

## 2014-07-13 NOTE — Op Note (Signed)
PATIENT NAME:  Katie Larsen, [name] MR#:  [id number] DATE OF BIRTH:  11-09-1955  DATE OF PROCEDURE:  06/26/2014  PREOPERATIVE DIAGNOSES:   1.  End-stage renal disease.  2.  Left brachiobasilic arteriovenous fistula found to be patent on imaging and has now been used for dialysis recently.  3.  Hypertension.  POSTOPERATIVE DIAGNOSES:   1.  End-stage renal disease.  2.  Left brachiobasilic arteriovenous fistula found to be patent on imaging and has now been used for dialysis recently.  3.  Hypertension.  PROCEDURE: Removal of right internal jugular PermCath.  SURGEON:  Festus BarrenJason Dew, MD.    ANESTHESIA:  Local.  ESTIMATED BLOOD LOSS:  Minimal.  INDICATION FOR PROCEDURE:  This is a 59 year old white female with end-stage renal disease. She had a brachiobasilic AV fistula which was slow to mature, but has been used in the last couple of weeks. She has undergone a fistulogram today to evaluate if there any issues with this or if this can be used for her chronic long-term access without difficulty. We had discussed that if this fistulogram was normal we remove her catheter and allow her to only use her fistula for dialysis if she required any major intervention and there was major injury to the fistula her catheter be left in place. When the fistulogram was normal catheter removal was indicated.   Risks and benefits were discussed and informed consent was obtained.   DESCRIPTION OF PROCEDURE:  The patient's right neck, chest, and existing catheter were sterilely prepped and draped.  The area around the catheter was anesthetized copiously with 1% Lidocaine. The catheter was dissected out with curved hemostats until the cuff was freed from the surrounding fibrous sheath.  The fibrous sheath was transected and the catheter was then removed in its entirety using gentle traction.  Pressure was held and sterile dressings placed.    The patient tolerated the procedure well and was taken to the recovery room in  stable condition.   ____________________________ Annice NeedyJason S. Dew, MD jsd:bu D: 06/26/2014 12:10:29 ET T: 06/26/2014 14:00:35 ET JOB#: [id number]  cc: Annice NeedyJason S. Dew, MD, <Dictator> Annice NeedyJASON S DEW MD ELECTRONICALLY SIGNED 07/09/2014 13:47

## 2014-07-16 ENCOUNTER — Other Ambulatory Visit
Admission: RE | Admit: 2014-07-16 | Discharge: 2014-07-16 | Disposition: A | Discharge status: Home / Self Care | Payer: Medicare Other | Source: Ambulatory Visit | Attending: Nephrology | Admitting: Nephrology

## 2014-07-16 DIAGNOSIS — D649 Anemia, unspecified: Secondary | ICD-10-CM | POA: Diagnosis present

## 2014-07-16 LAB — HEMOGLOBIN: Hemoglobin: 8 g/dL — ABNORMAL LOW (ref 12.0–16.0)

## 2014-09-23 ENCOUNTER — Other Ambulatory Visit
Admission: RE | Admit: 2014-09-23 | Discharge: 2014-09-23 | Disposition: A | Discharge status: Home / Self Care | Payer: Medicare Other | Source: Other Acute Inpatient Hospital | Attending: Nephrology | Admitting: Nephrology

## 2014-09-23 DIAGNOSIS — N186 End stage renal disease: Secondary | ICD-10-CM | POA: Diagnosis present

## 2014-09-23 LAB — POTASSIUM: Potassium: 7.1 mmol/L (ref 3.5–5.1)

## 2014-09-27 ENCOUNTER — Other Ambulatory Visit
Admission: RE | Admit: 2014-09-27 | Discharge: 2014-09-27 | Disposition: A | Discharge status: Home / Self Care | Payer: Medicare Other | Source: Ambulatory Visit | Attending: Nephrology | Admitting: Nephrology

## 2014-09-27 DIAGNOSIS — N186 End stage renal disease: Secondary | ICD-10-CM | POA: Diagnosis present

## 2014-09-27 LAB — POTASSIUM: Potassium: 6.1 mmol/L — ABNORMAL HIGH (ref 3.5–5.1)

## 2014-12-29 IMAGING — CR DG CHEST 2V
1 series · 2 of 2 positions shown · non-contrast
Comparison: 11/22/2013.

CLINICAL DATA: Shortness of breath.

EXAM:
CHEST  2 VIEW

[Series 1: dxr chest pa (or ap) and lateral · 0.14mm/px · 2 of 2 slices shown]
[im 1/2]
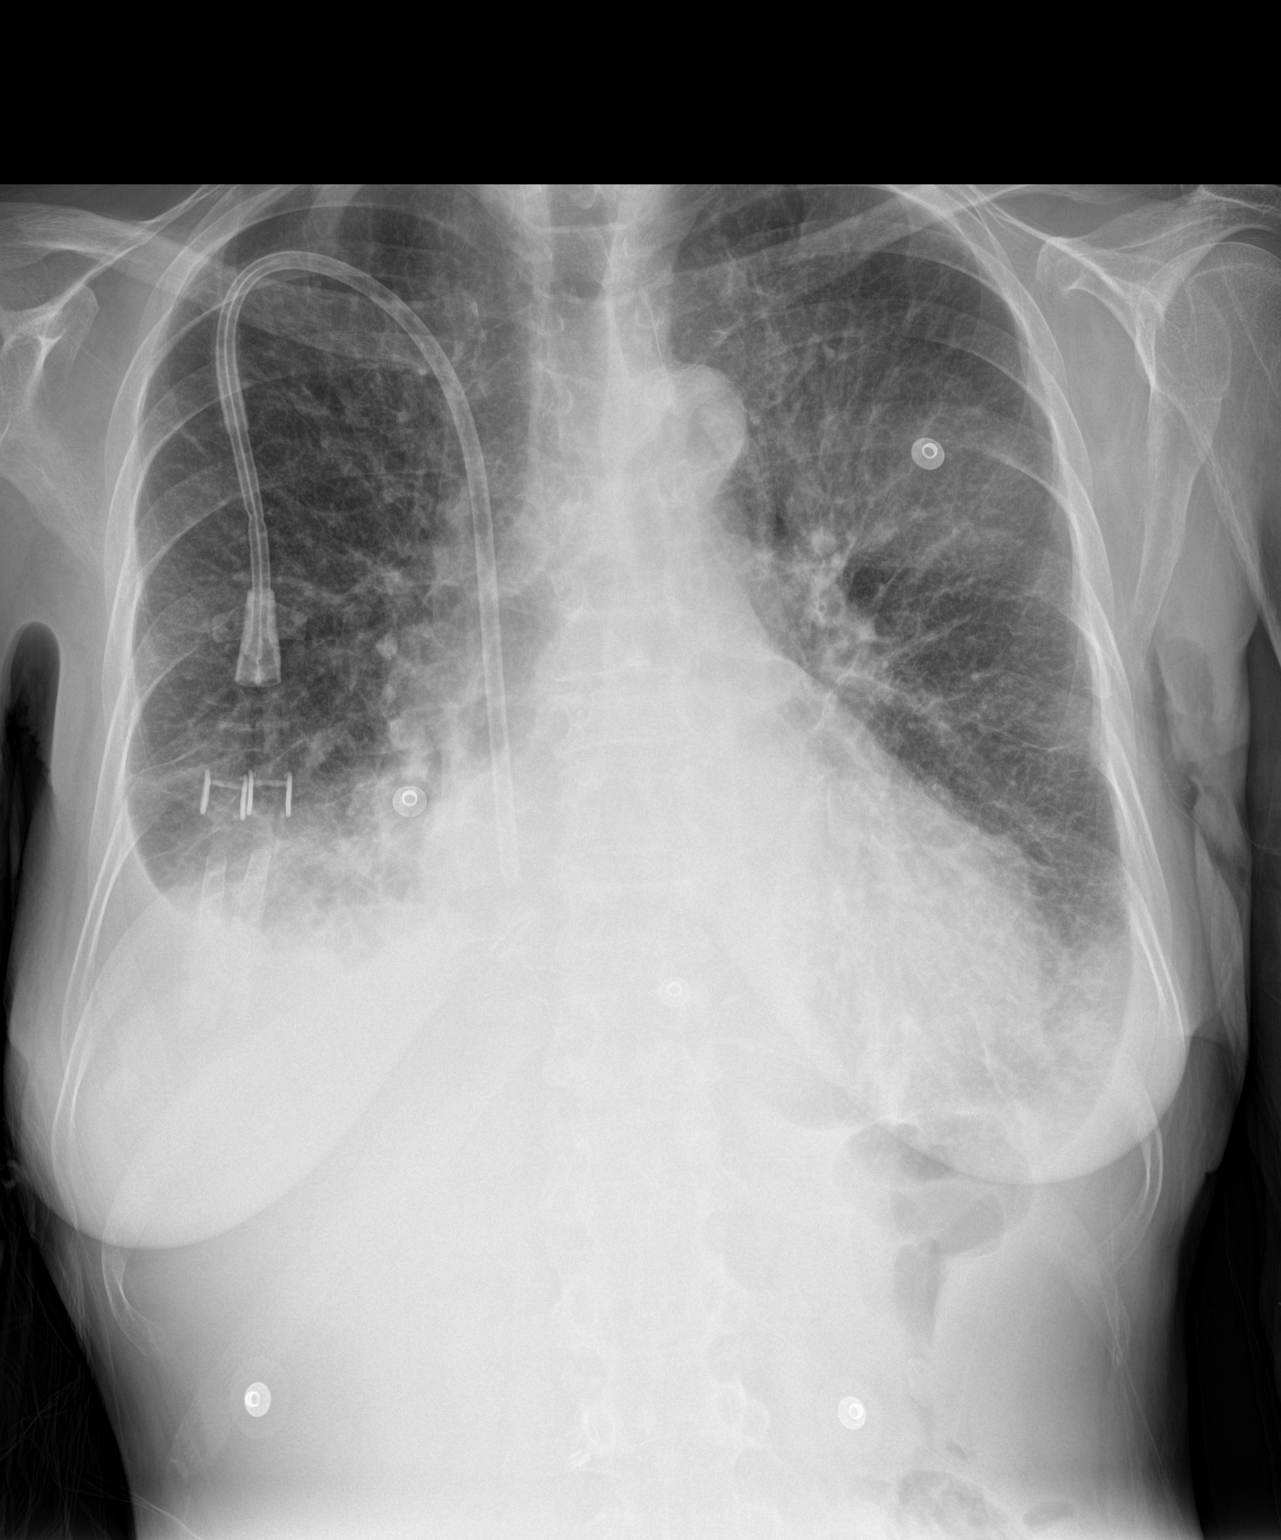
[im 2/2]
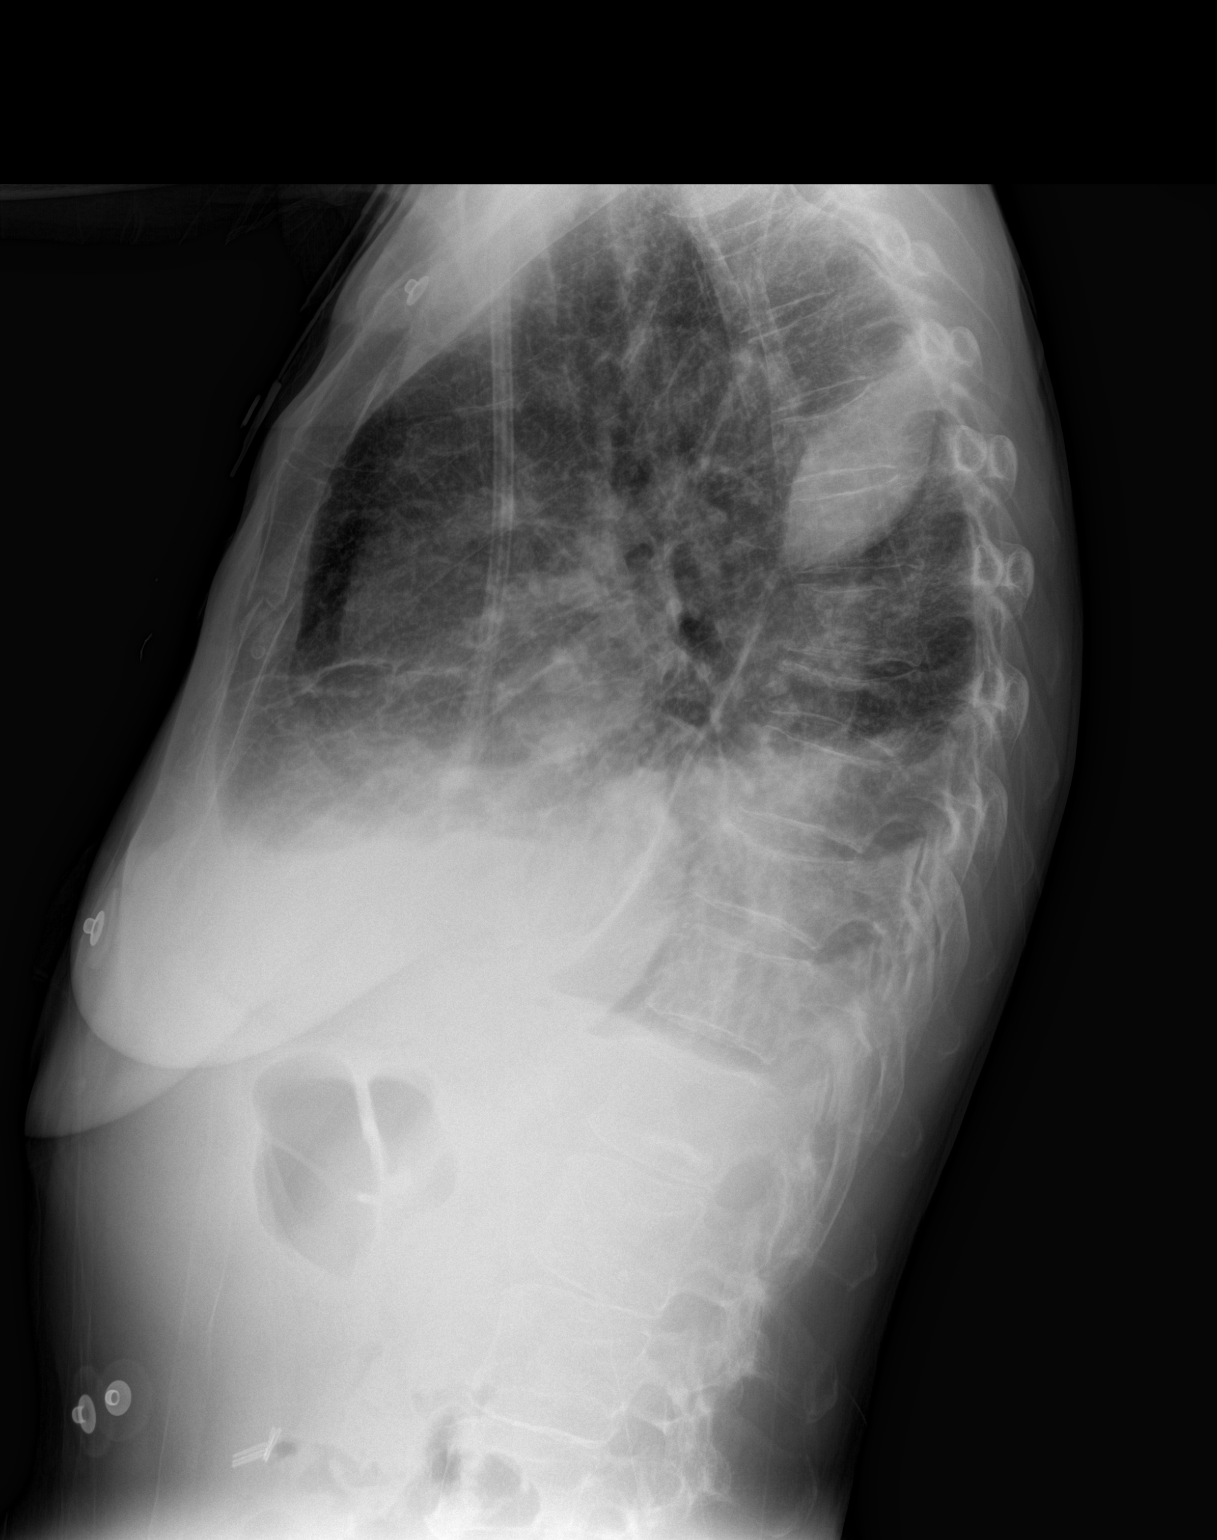

[2 of 2 positions shown; findings below may reference images not displayed]

FINDINGS: Unchanged RIGHT IJ dialysis catheter. Bilateral
right-greater-than-left pleural effusions. The RIGHT pleural
effusion is small to moderate, in the LEFT pleural effusion is
small. There is associated atelectasis. Pulmonary vascular
congestion. Cardiomegaly. Portions of the cardiopericardial
silhouette are obscured by airspace disease and effusions at the
bases. There is widening of the vascular pedicle compared to prior.
Pseudotumor is present in the LEFT major fissure which has increased
compared to prior exam. Cholecystectomy clips are present in the
right upper quadrant.
IMPRESSION: Progressive volume overload, with right-greater-than-left pleural
effusions.

## 2014-12-29 IMAGING — CR DG THORACIC SPINE 2-3V
1 series · 3 of 3 positions shown · non-contrast
Comparison: 11/22/2013

CLINICAL DATA: Back pain, lower extremity swelling

EXAM:
THORACIC SPINE - 2 VIEW

[Series 1: dxr thoracic  ap and lateral · 0.14mm/px · 3 of 3 slices shown]
[im 1/3]
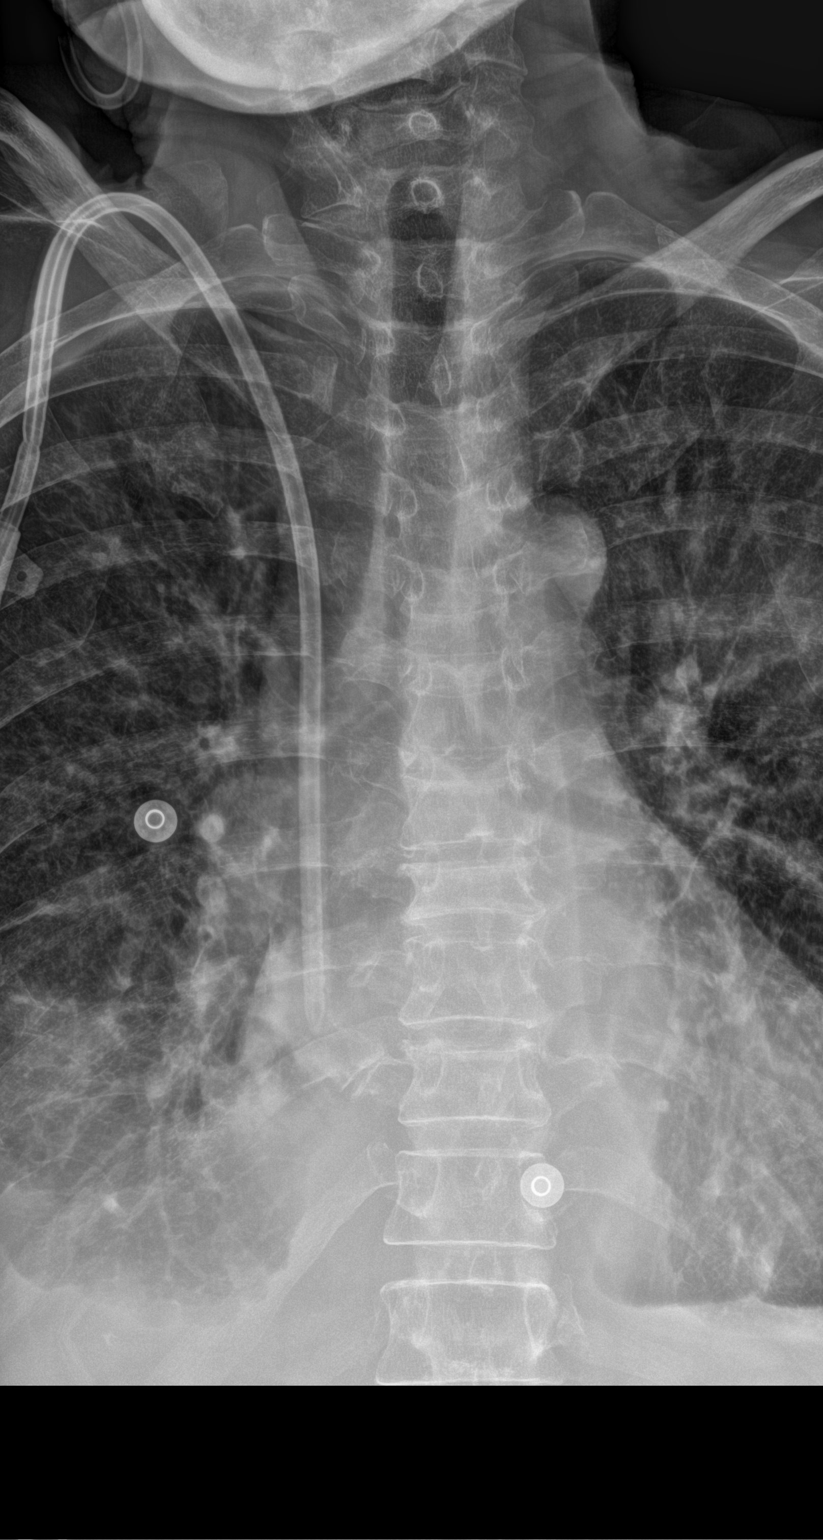
[im 2/3]
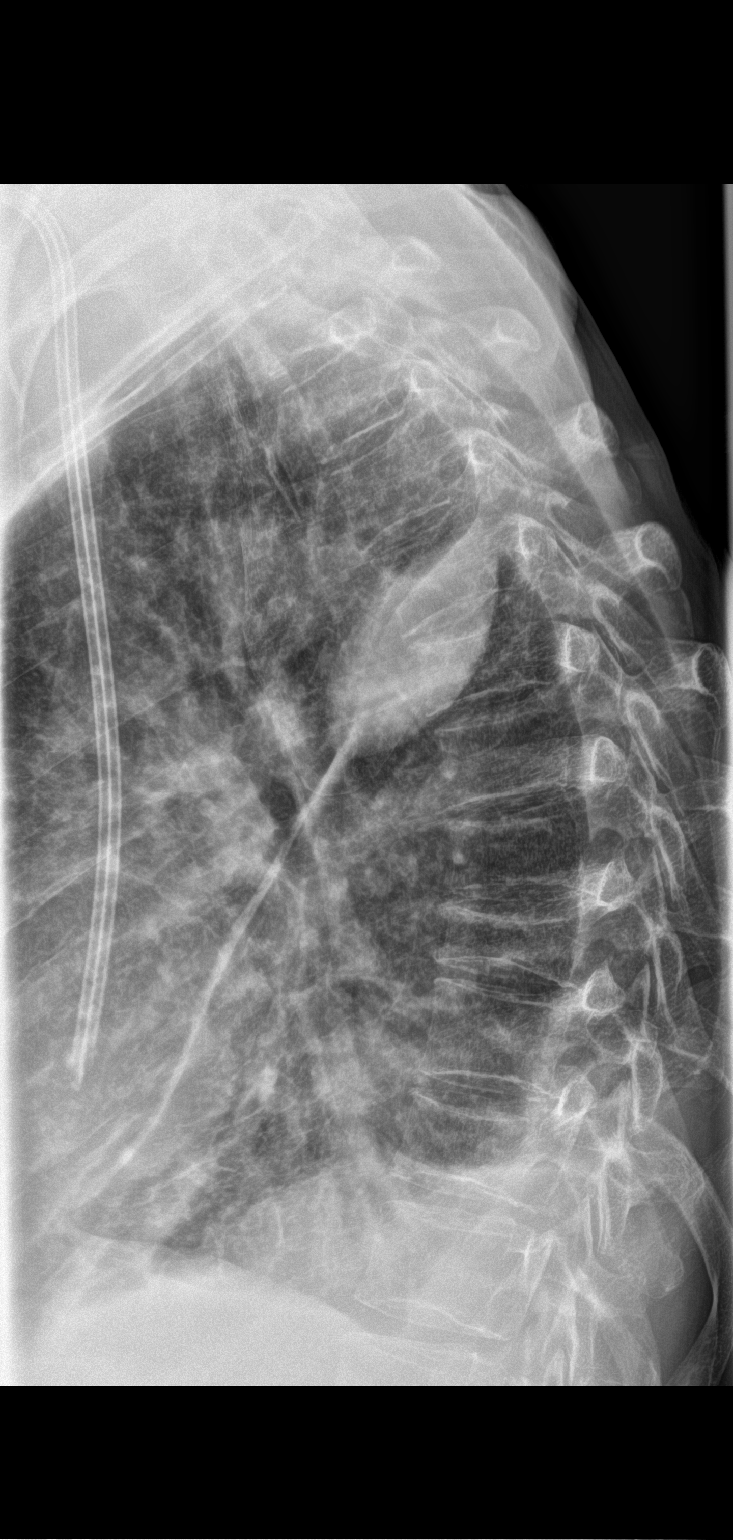
[im 3/3]
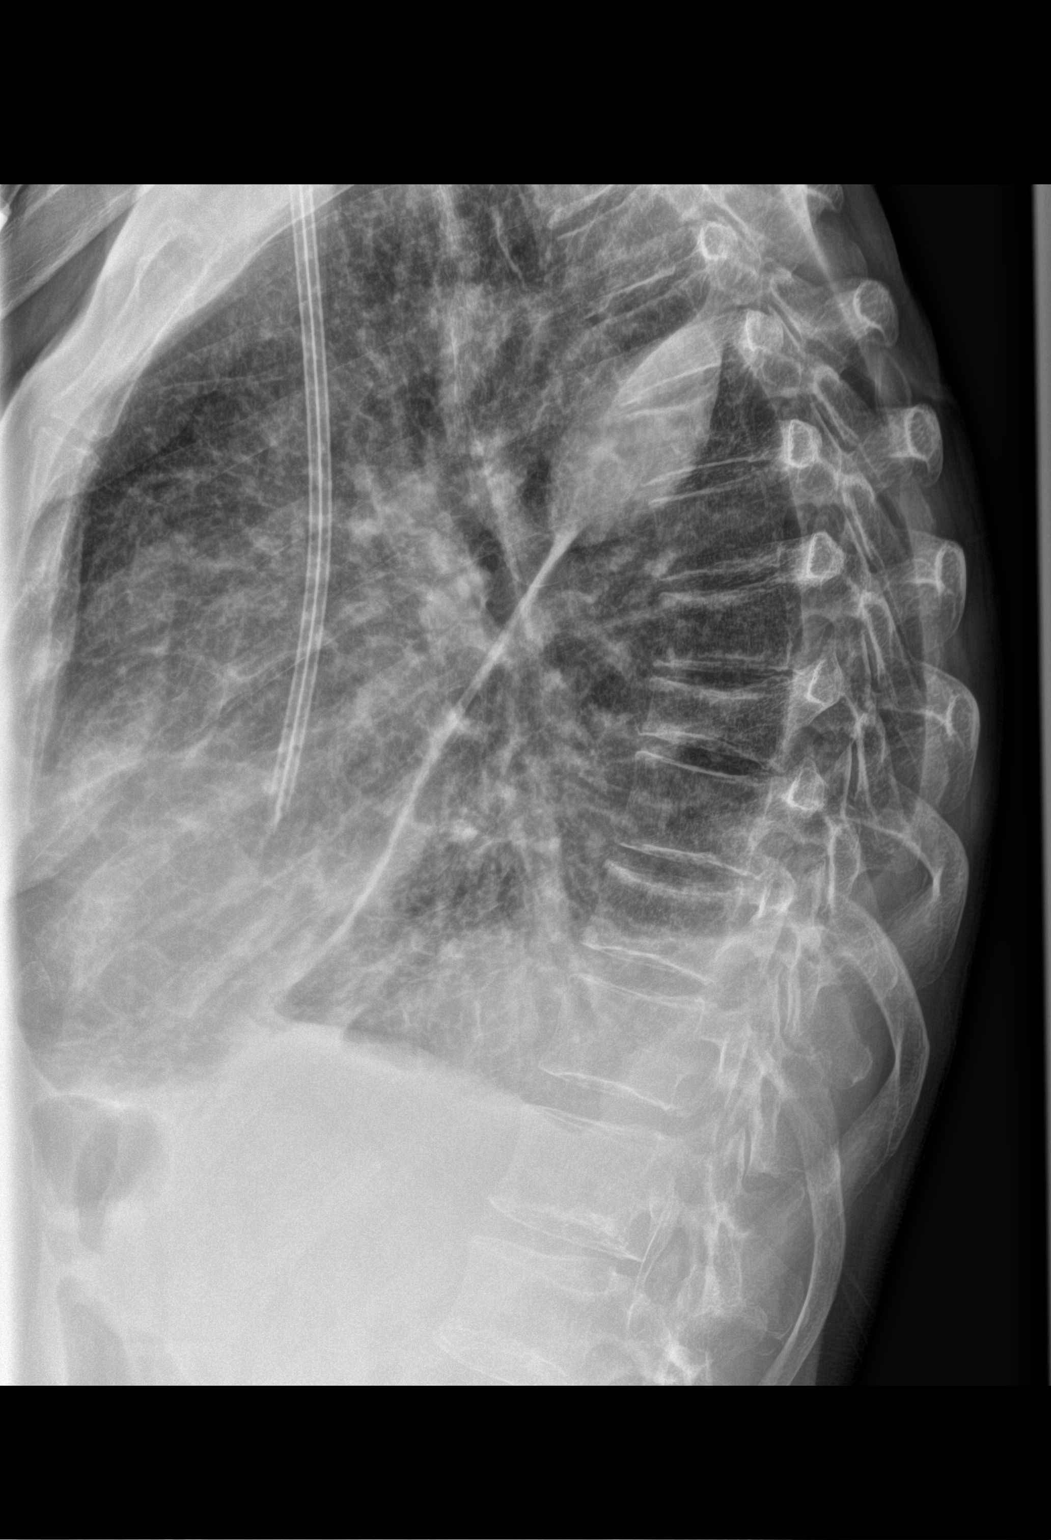

[3 of 3 positions shown; findings below may reference images not displayed]

FINDINGS: Three views of thoracic spine submitted. No acute fracture or
subluxation. There is increased loculated fluid in left major
fissure. Bilateral small pleural effusion. Stable compression
deformities mid and lower thoracic spine.
IMPRESSION: No acute fracture or subluxation. Increased loculated fluid in left
major fissure. Bilateral small pleural effusion. Stable compression
deformities within thoracic spine.

## 2014-12-31 IMAGING — US US GUIDE NEEDLE - US [PERSON_NAME]
1 series · 4 of 4 positions shown · non-contrast
Comparison: Chest radiograph of same day.

CLINICAL DATA: Right pleural effusion.

EXAM:
ULTRASOUND GUIDED right THORACENTESIS

[Series 1: us guide needle - us (person_name) · 0.27mm/px · 4 of 4 slices shown]
[im 1/4]
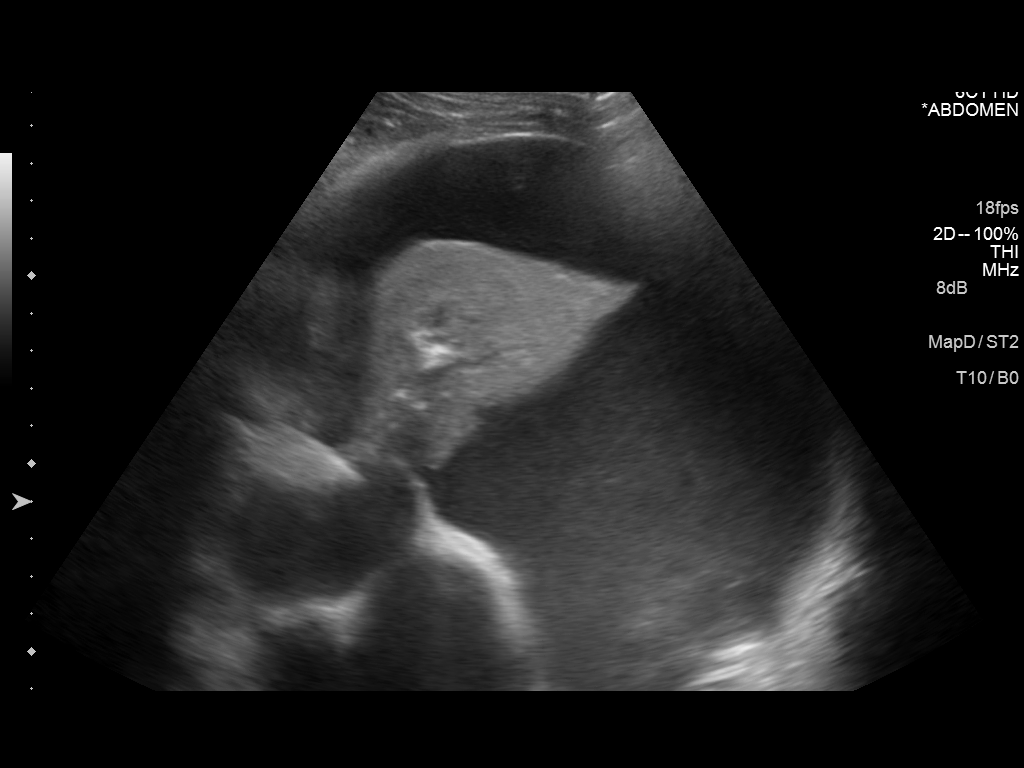
[im 2/4]
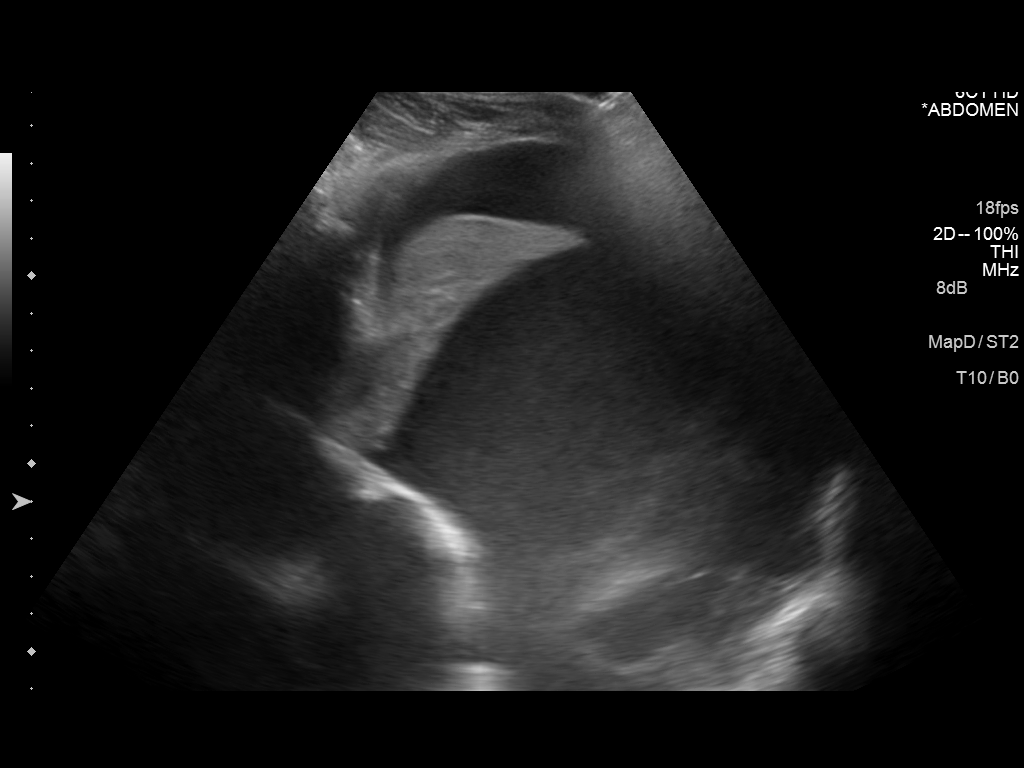
[im 3/4]
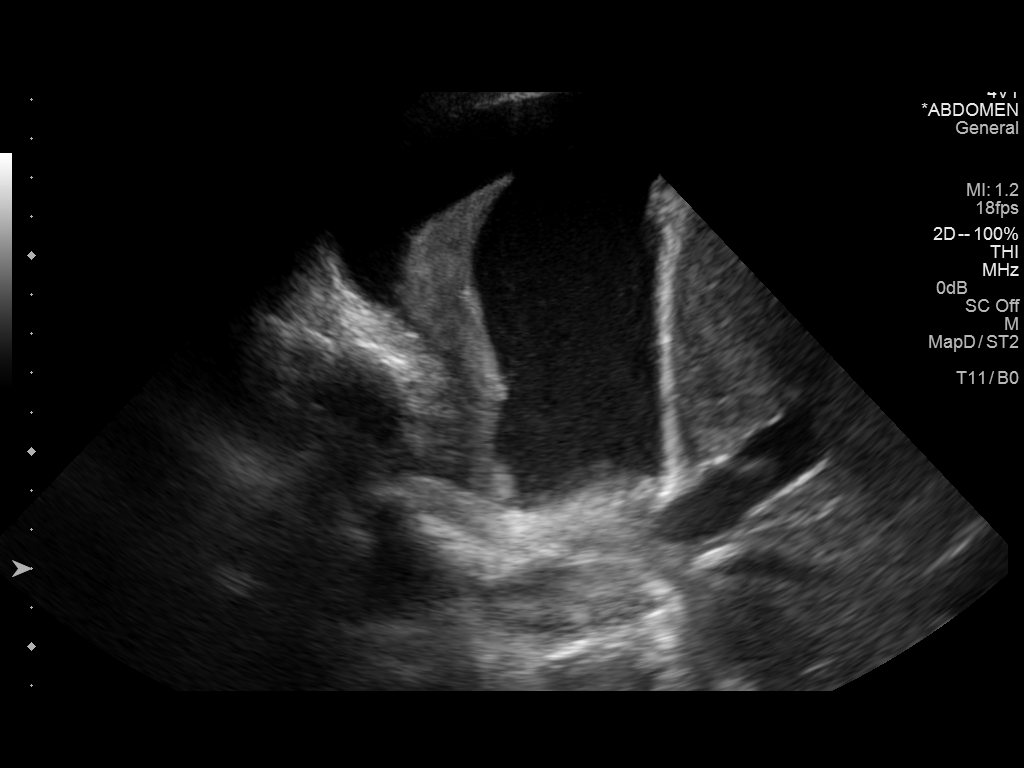
[im 4/4]
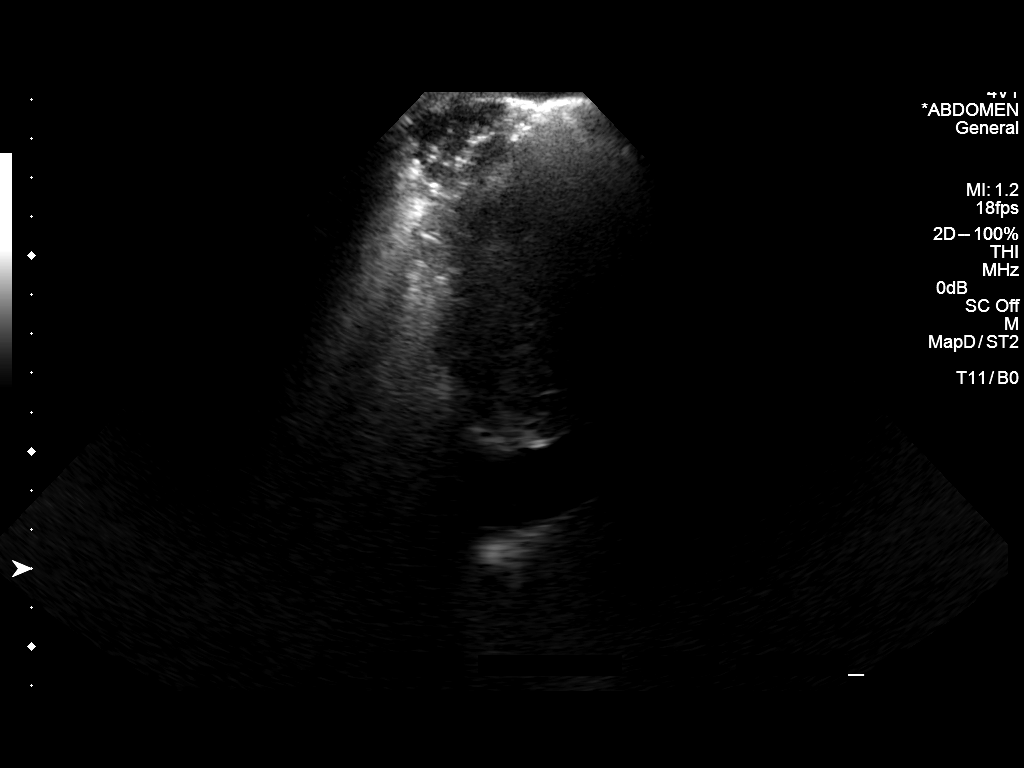

[4 of 4 positions shown; findings below may reference images not displayed]

PROCEDURE:
An ultrasound guided thoracentesis was thoroughly discussed with the
patient and questions answered. The benefits, risks, alternatives
and complications were also discussed. The patient understands and
wishes to proceed with the procedure. Written consent was obtained.

Ultrasound was performed to localize and mark an adequate pocket of
fluid in the right chest. The area was then prepped and draped in
the normal sterile fashion. 1% Lidocaine was used for local
anesthesia. Under ultrasound guidance a Safe-T-Centesis catheter was
introduced. Thoracentesis was performed. The catheter was removed
and a dressing applied.

Complications:  None.
FINDINGS: A total of approximately 1 L of serous fluid was removed. A fluid
sample was notsent for laboratory analysis.
IMPRESSION: Successful ultrasound guided right thoracentesis yielding 1 L of
pleural fluid.

## 2015-01-01 IMAGING — CR DG CHEST 1V PORT
1 series · 1 of 1 positions shown · non-contrast
Comparison: None.

CLINICAL DATA: Chest pain

EXAM:
PORTABLE CHEST - 1 VIEW

[ap]
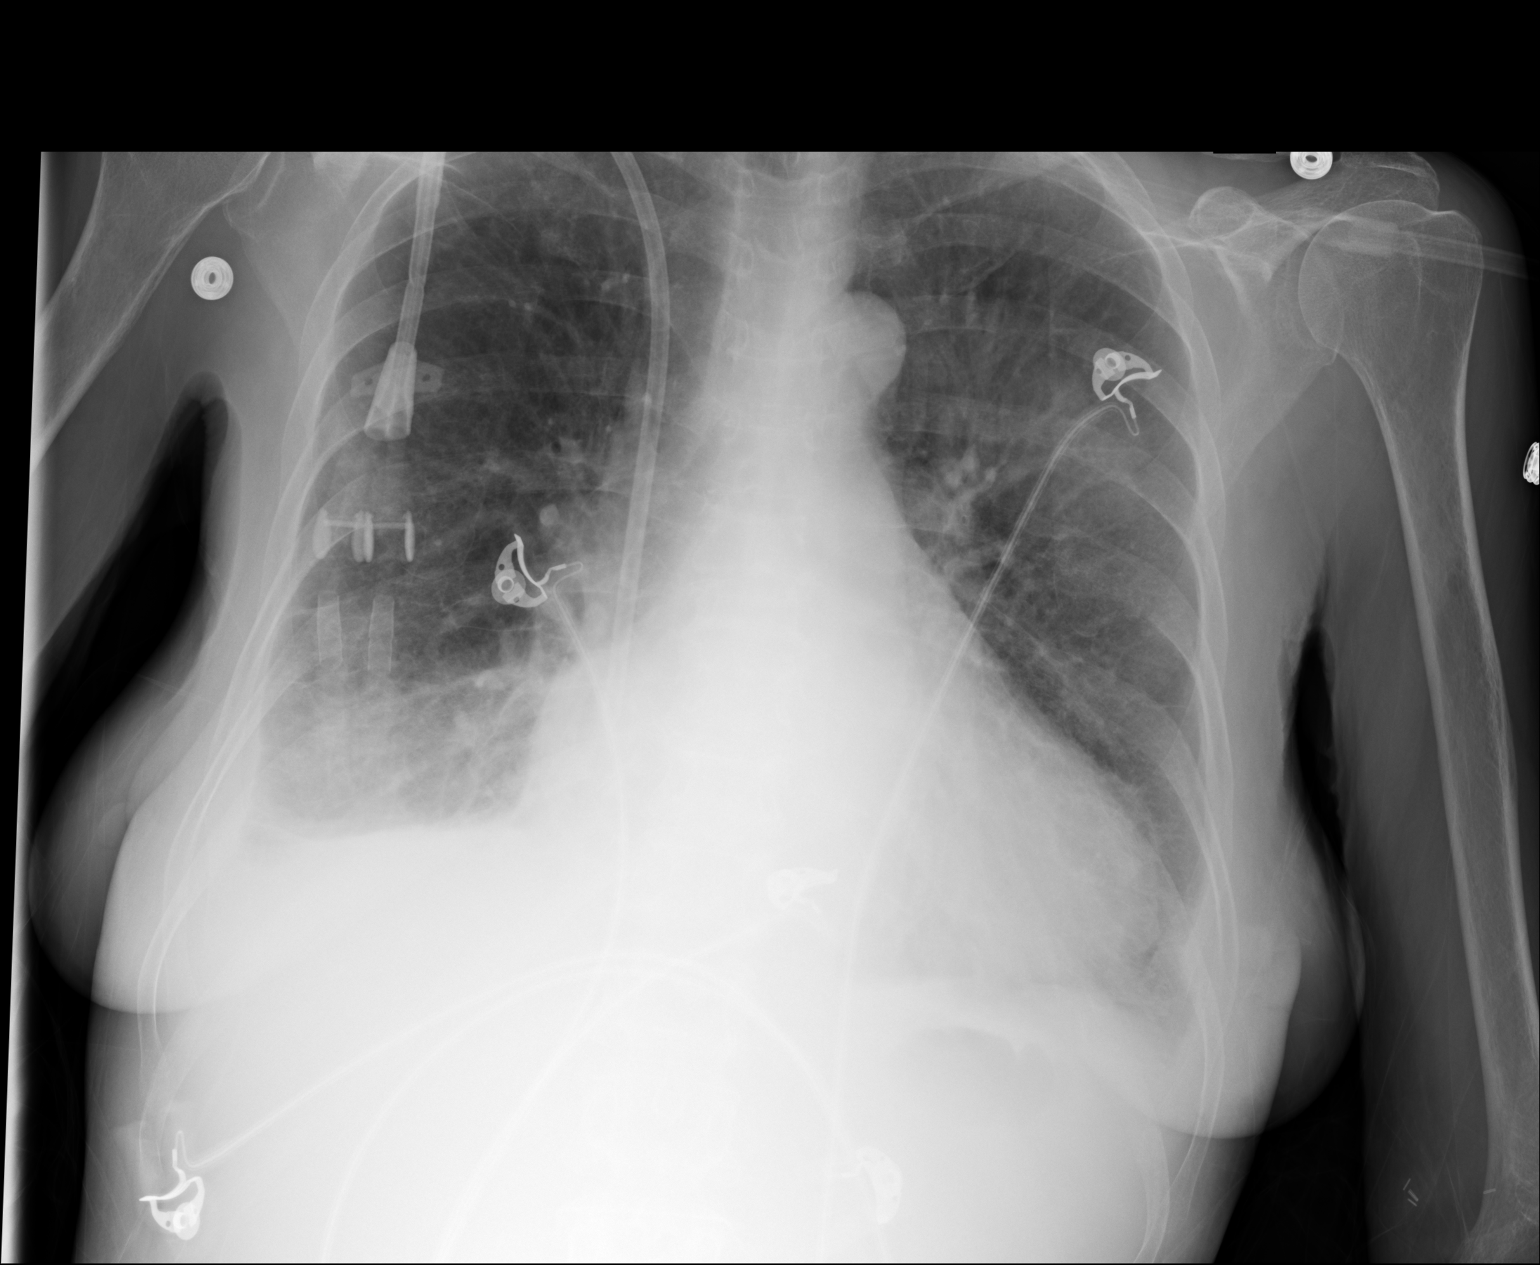

[1 of 1 positions shown; findings below may reference images not displayed]

FINDINGS: Cardiac shadow remains mildly enlarged. A dialysis catheter is again
noted on the right. Small right-sided pleural effusion is now seen.
Mild increase in the degree of right basilar atelectasis is noted.
The left lung is clear.
IMPRESSION: Mild recurrent right pleural effusion and atelectasis. No other
focal abnormality is noted.

## 2015-01-25 ENCOUNTER — Emergency Department
Admission: EM | Admit: 2015-01-25 | Discharge: 2015-01-25 | Disposition: A | Discharge status: Home / Self Care | Payer: Medicare Other | Attending: Emergency Medicine | Admitting: Emergency Medicine

## 2015-01-25 ENCOUNTER — Encounter: Payer: Self-pay | Admitting: Emergency Medicine

## 2015-01-25 DIAGNOSIS — I10 Essential (primary) hypertension: Secondary | ICD-10-CM | POA: Insufficient documentation

## 2015-01-25 DIAGNOSIS — Z87891 Personal history of nicotine dependence: Secondary | ICD-10-CM | POA: Diagnosis not present

## 2015-01-25 DIAGNOSIS — M79641 Pain in right hand: Secondary | ICD-10-CM | POA: Diagnosis present

## 2015-01-25 DIAGNOSIS — W4904XA Ring or other jewelry causing external constriction, initial encounter: Secondary | ICD-10-CM | POA: Insufficient documentation

## 2015-01-25 DIAGNOSIS — R2231 Localized swelling, mass and lump, right upper limb: Secondary | ICD-10-CM | POA: Insufficient documentation

## 2015-01-25 DIAGNOSIS — M25441 Effusion, right hand: Secondary | ICD-10-CM

## 2015-01-25 HISTORY — DX: Chronic obstructive pulmonary disease, unspecified: J44.9

## 2015-01-25 HISTORY — DX: Essential (primary) hypertension: I10

## 2015-01-25 NOTE — ED Provider Notes (Signed)
[facility] Emergency Department Provider Note ____________________________________________  Time seen: Approximately12:46 PM  I have reviewed the triage vital signs and the nursing notes.   HISTORY  Chief Complaint Hand Pain   HPI Katie Larsen is a 59 y.o. female who presents to the emergency department for ring removal. She put the ring on 4 days ago and her finger has now swollen to the point that she can not remove it. No modifying factors or associated symptoms.   Past Medical History  Diagnosis Date  . COPD (chronic obstructive pulmonary disease) (HCC)   . Hypertension     There are no active problems to display for this patient.   Past Surgical History  Procedure Laterality Date  . Cholecystectomy      No current outpatient prescriptions on file.  Allergies Hydrocodone  No family history on file.  Social History Social History  Substance Use Topics  . Smoking status: Former Games developermoker  . Smokeless tobacco: None  . Alcohol Use: No    Review of Systems Constitutional: No recent illness. Eyes: No visual changes. ENT: No sore throat. Cardiovascular: Denies chest pain or palpitations. Respiratory: Denies shortness of breath. Gastrointestinal: No abdominal pain.  Genitourinary: Negative for dysuria. Musculoskeletal: Pain in left fourth finger. Skin: Negative for rash. Neurological: Negative for headaches, focal weakness or numbness. 10-point ROS otherwise negative.  ____________________________________________   PHYSICAL EXAM:  VITAL SIGNS: ED Triage Vitals  Enc Vitals Group     BP 01/25/15 1137 163/90 mmHg     Pulse Rate 01/25/15 1137 72     Resp 01/25/15 1137 20     Temp 01/25/15 1137 98.2 F (36.8 C)     Temp Source 01/25/15 1137 Oral     SpO2 01/25/15 1137 92 %     Weight 01/25/15 1137 128 lb (58.06 kg)     Height 01/25/15 1137 5\' 4"  (1.626 m)     Head Cir --      Peak Flow --      Pain Score 01/25/15 1138 0   Pain Loc --      Pain Edu? --      Excl. in GC? --     Constitutional: Alert and oriented. Well appearing and in no acute distress. Eyes: Conjunctivae are normal. EOMI. Head: Atraumatic. Nose: No congestion/rhinnorhea. Neck: No stridor.  Respiratory: Normal respiratory effort.   Musculoskeletal: Mild swelling above the PIP of the left fourth digit. Neurologic:  Normal speech and language. No gross focal neurologic deficits are appreciated. Speech is normal. No gait instability. Skin:  Skin is warm, dry and intact. Atraumatic. Psychiatric: Mood and affect are normal. Speech and behavior are normal.  ____________________________________________   LABS (all labs ordered are listed, but only abnormal results are displayed)  Labs Reviewed - No data to display ____________________________________________  RADIOLOGY  Not indicated ____________________________________________   PROCEDURES  Procedure(s) performed:  Ring removed by electric ring cutter.   ____________________________________________   INITIAL IMPRESSION / ASSESSMENT AND PLAN / ED COURSE  Pertinent labs & imaging results that were available during my care of the patient were reviewed by me and considered in my medical decision making (see chart for details).  Patient insisted that the ring be cut and no attempt made to be removed intact. She consisted also that a digital block be performed prior to removal. Ring was removed and given to the significant other in the room. ____________________________________________   FINAL CLINICAL IMPRESSION(S) / ED DIAGNOSES  Final diagnoses:  Swelling joint, finger, hand, right  Ring or other jewelry causing external constriction, initial encounter       Chinita Pester, FNP 01/25/15 1901  Jene Every, MD 01/26/15 1356

## 2015-01-25 NOTE — ED Notes (Signed)
States she is having to right 4 th finger  Wants ring cut off

## 2015-01-26 IMAGING — CR DG CHEST POST BIOPSY - THORACENTESIS
1 series · 1 of 1 positions shown · non-contrast
Comparison: November 29, 2013.

CLINICAL DATA: Status post right thoracentesis. Right pleural
effusion.

EXAM:
CHEST XRAY 1 VIEW

[dxr chest 1 view [person_name]]
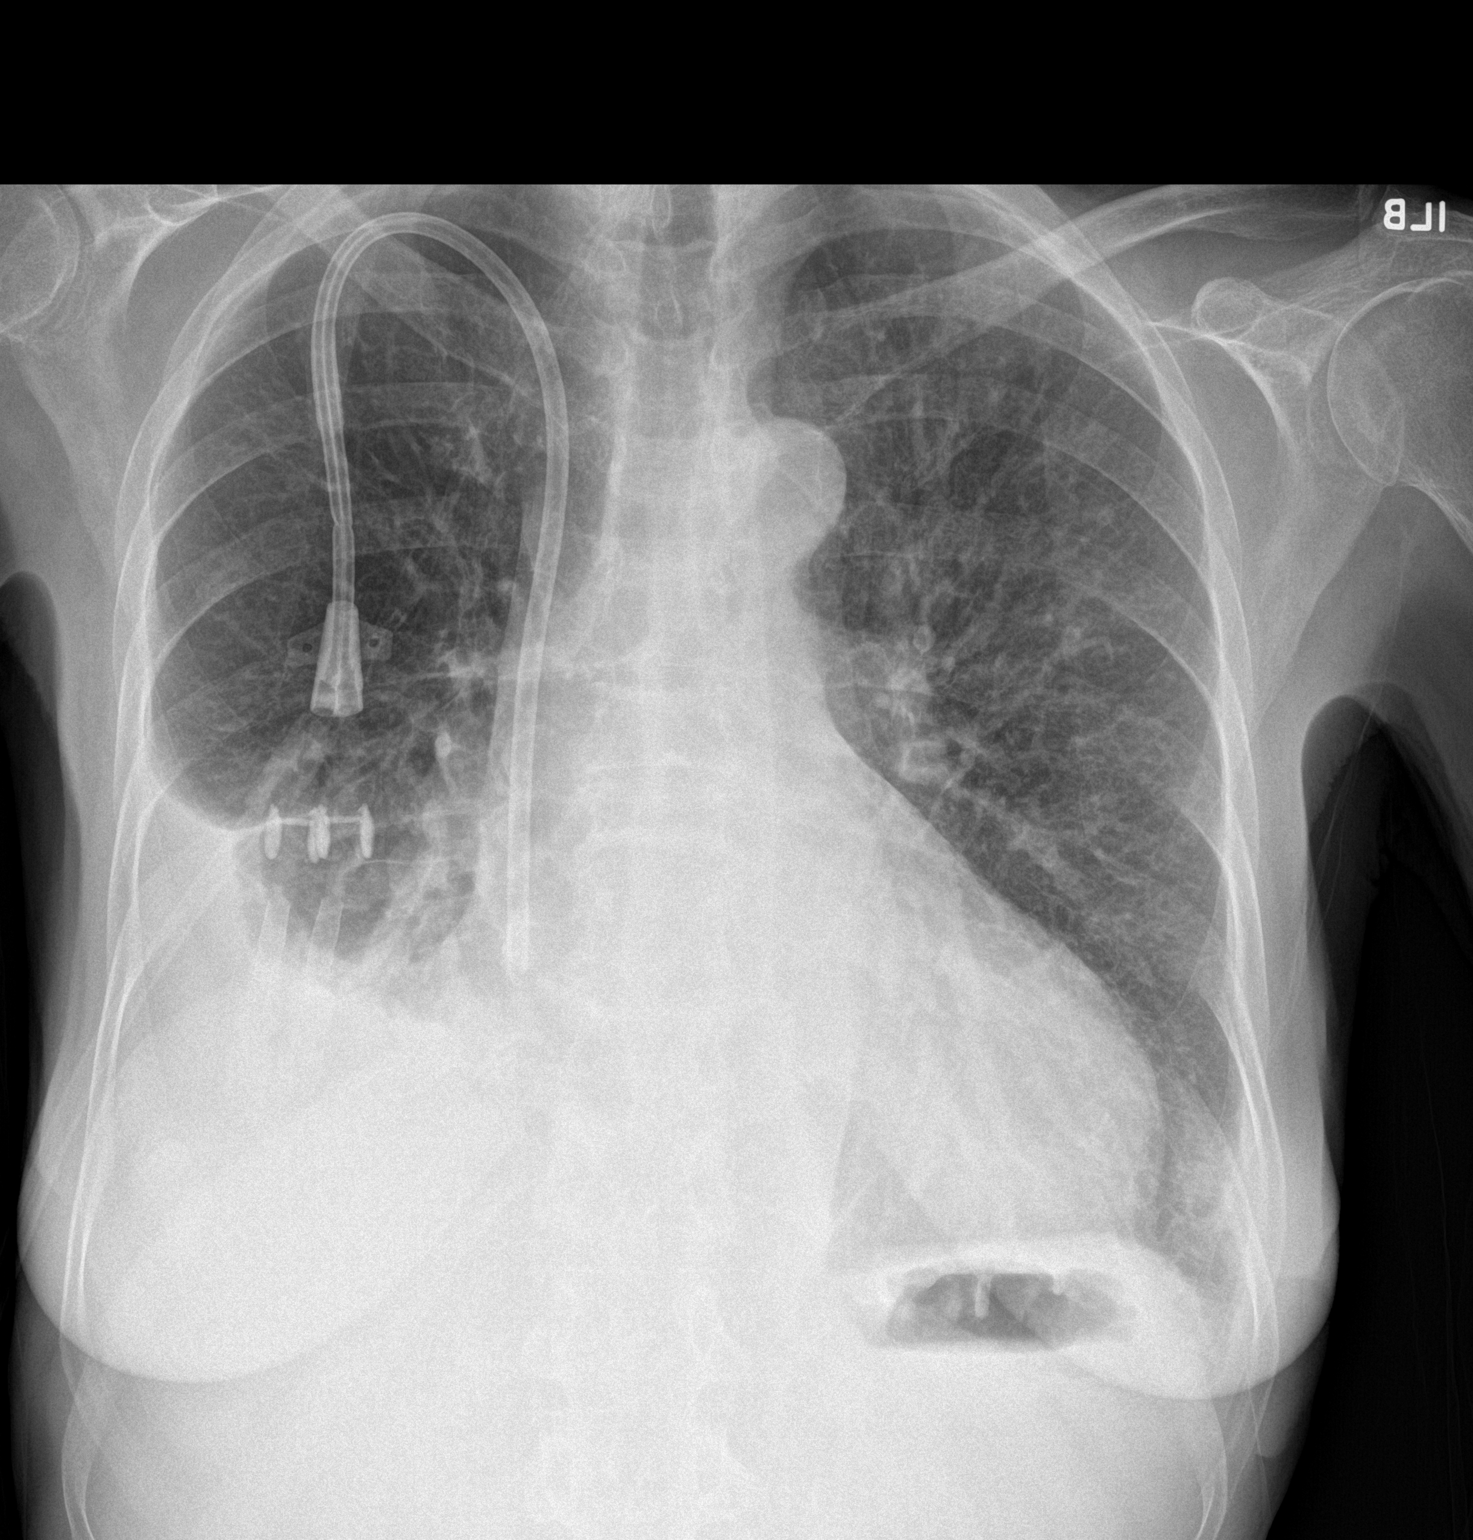

[1 of 1 positions shown; findings below may reference images not displayed]

FINDINGS: Stable cardiomediastinal silhouette. Right internal jugular dialysis
catheter is unchanged. No pneumothorax is seen status post
right-sided thoracentesis. Mild right pleural effusion remains. Left
lung is clear.
IMPRESSION: No pneumothorax status post right-sided thoracentesis.

## 2015-04-15 ENCOUNTER — Other Ambulatory Visit
Admission: RE | Admit: 2015-04-15 | Discharge: 2015-04-15 | Disposition: A | Discharge status: Home / Self Care | Payer: Medicare Other | Source: Ambulatory Visit | Attending: Nephrology | Admitting: Nephrology

## 2015-04-15 DIAGNOSIS — N186 End stage renal disease: Secondary | ICD-10-CM | POA: Insufficient documentation

## 2015-04-15 DIAGNOSIS — E875 Hyperkalemia: Secondary | ICD-10-CM | POA: Insufficient documentation

## 2015-04-15 LAB — POTASSIUM: POTASSIUM: 4.6 mmol/L (ref 3.5–5.1)

## 2015-07-06 ENCOUNTER — Encounter: Payer: Self-pay | Admitting: Emergency Medicine

## 2015-07-06 ENCOUNTER — Emergency Department
Admission: EM | Admit: 2015-07-06 | Discharge: 2015-07-06 | Disposition: A | Discharge status: Home / Self Care | Payer: Medicare Other | Attending: Emergency Medicine | Admitting: Emergency Medicine

## 2015-07-06 DIAGNOSIS — Z87891 Personal history of nicotine dependence: Secondary | ICD-10-CM | POA: Diagnosis not present

## 2015-07-06 DIAGNOSIS — Z79899 Other long term (current) drug therapy: Secondary | ICD-10-CM | POA: Insufficient documentation

## 2015-07-06 DIAGNOSIS — Z992 Dependence on renal dialysis: Secondary | ICD-10-CM | POA: Insufficient documentation

## 2015-07-06 DIAGNOSIS — I151 Hypertension secondary to other renal disorders: Secondary | ICD-10-CM | POA: Insufficient documentation

## 2015-07-06 DIAGNOSIS — T82530A Leakage of surgically created arteriovenous fistula, initial encounter: Secondary | ICD-10-CM | POA: Diagnosis not present

## 2015-07-06 DIAGNOSIS — J449 Chronic obstructive pulmonary disease, unspecified: Secondary | ICD-10-CM | POA: Diagnosis not present

## 2015-07-06 DIAGNOSIS — T829XXA Unspecified complication of cardiac and vascular prosthetic device, implant and graft, initial encounter: Secondary | ICD-10-CM

## 2015-07-06 DIAGNOSIS — Y622 Failure of sterile precautions during kidney dialysis and other perfusion: Secondary | ICD-10-CM | POA: Diagnosis not present

## 2015-07-06 DIAGNOSIS — T82838A Hemorrhage of vascular prosthetic devices, implants and grafts, initial encounter: Secondary | ICD-10-CM | POA: Diagnosis present

## 2015-07-06 HISTORY — DX: Disorder of kidney and ureter, unspecified: N28.9

## 2015-07-06 MED ORDER — HYDRALAZINE HCL 50 MG PO TABS
100.0000 mg | ORAL_TABLET | Freq: Once | ORAL | Status: AC
  Administered 2015-07-06: 100 mg via ORAL
  Filled 2015-07-06: qty 2

## 2015-07-06 MED ORDER — TRAMADOL HCL 50 MG PO TABS
50.0000 mg | ORAL_TABLET | Freq: Once | ORAL | Status: AC
  Administered 2015-07-06: 50 mg via ORAL
  Filled 2015-07-06: qty 1

## 2015-07-06 MED ORDER — CARVEDILOL 25 MG PO TABS
25.0000 mg | ORAL_TABLET | Freq: Once | ORAL | Status: AC
  Administered 2015-07-06: 25 mg via ORAL
  Filled 2015-07-06: qty 1

## 2015-07-06 MED ORDER — LISINOPRIL 10 MG PO TABS
20.0000 mg | ORAL_TABLET | Freq: Once | ORAL | Status: AC
  Administered 2015-07-06: 20 mg via ORAL
  Filled 2015-07-06: qty 2

## 2015-07-06 NOTE — Discharge Instructions (Signed)
After pressure, it appears the bleeding has stopped. An additional slight pressure was applied with Ace wrap. If your hands or fingers feel numb or tingly or bluish, you can loosen the Ace wrap. Leave the Ace wrap in place otherwise until removed tomorrow at dialysis evaluation.  Return to emergency department for any weakness numbness, or tingling of the extremity, or certainly any additional bleeding.

## 2015-07-06 NOTE — ED Provider Notes (Signed)
[facility] Emergency Department Provider Note   ____________________________________________  Time seen:  I have reviewed the triage vital signs and the triage nursing note.  HISTORY  Chief Complaint Bleeding/Bruising   Historian Patient  HPI Katie Larsen is a 60 y.o. female who has a left upper arm fistula for dialysis. She was at dialysis today and they stopped her dialysis early because she started having bleeding around the needle puncture site. It sounds like a pressure dressing was applied and patient was sent home with instructions to come back to dialysis tomorrow for the rest of her dialysis to be completed.  Patient was at home and she lifted the pressure dressing or take a look and it started squirting blood into the air.  She's not reporting any weakness, chest pain or shortness of breath. EMS did wrap the arm.  Symptoms are mild to moderate.    Past Medical History  Diagnosis Date  . COPD (chronic obstructive pulmonary disease) (HCC)   . Hypertension   . Renal disorder     There are no active problems to display for this patient.   Past Surgical History  Procedure Laterality Date  . Cholecystectomy      Current Outpatient Rx  Name  Route  Sig  Dispense  Refill  . carvedilol (COREG) 25 MG tablet   Oral   Take 25 mg by mouth 2 (two) times daily with a meal.         . hydrALAZINE (APRESOLINE) 100 MG tablet   Oral   Take 100 mg by mouth 2 (two) times daily.         Marland Kitchen. lisinopril (PRINIVIL,ZESTRIL) 20 MG tablet   Oral   Take 20 mg by mouth daily.         . traMADol (ULTRAM) 50 MG tablet   Oral   Take 50 mg by mouth every morning.           Allergies Hydrocodone  History reviewed. No pertinent family history.  Social History Social History  Substance Use Topics  . Smoking status: Former Games developermoker  . Smokeless tobacco: None  . Alcohol Use: No    Review of Systems  Constitutional: Negative for Recent  illnesses. Eyes: Negative for visual changes. ENT: Negative for nosebleed Cardiovascular: Negative for chest pain. Respiratory: Negative for shortness of breath. Gastrointestinal: Negative for abdominal pain, vomiting and diarrhea. Genitourinary:  Musculoskeletal: Left arm bleeding as per history of present illness Skin: Negative for rash. Neurological: Negative for headache. 10 point Review of Systems otherwise negative ____________________________________________   PHYSICAL EXAM:  VITAL SIGNS: ED Triage Vitals  Enc Vitals Group     BP 07/06/15 1556 160/118 mmHg     Pulse Rate 07/06/15 1556 73     Resp 07/06/15 1556 18     Temp 07/06/15 1556 98.7 F (37.1 C)     Temp Source 07/06/15 1556 Oral     SpO2 07/06/15 1556 97 %     Weight 07/06/15 1556 120 lb (54.432 kg)     Height 07/06/15 1556 5\' 4"  (1.626 m)     Head Cir --      Peak Flow --      Pain Score 07/06/15 1844 7     Pain Loc --      Pain Edu? --      Excl. in GC? --      Constitutional: Alert and oriented. Well appearing and in no distress. HEENT   Head: Normocephalic and  atraumatic.      Eyes: Conjunctivae are normal. PERRL. Normal extraocular movements.      Ears:         Nose: No congestion/rhinnorhea.Wearing home oxygen.   Mouth/Throat: Mucous membranes are moist.   Neck: No stridor. Cardiovascular/Chest: Normal rate, regular rhythm.  No murmurs, rubs, or gallops. Respiratory: Normal respiratory effort without tachypnea nor retractions. Breath sounds are clear and equal bilaterally. No wheezes/rales/rhonchi. Gastrointestinal: Soft. No distention, no guarding, no rebound. Nontender.    Genitourinary/rectal:Deferred Musculoskeletal: Left arm fistula with a palpable thrill. EMS dressing taken down. Original gauze and tape dressing is saturated dried blood, no active bleeding. Neurologic:  Normal speech and language. No gross or focal neurologic deficits are appreciated. Skin:  Skin is warm, dry and  intact. No rash noted. Psychiatric: Mood and affect are normal. Speech and behavior are normal. Patient exhibits appropriate insight and judgment.  ____________________________________________   EKG I, Governor Rooks, MD, the attending physician have personally viewed and interpreted all ECGs.  None ____________________________________________  LABS (pertinent positives/negatives)  None  ____________________________________________  RADIOLOGY All Xrays were viewed by me. Imaging interpreted by Radiologist.  None __________________________________________  PROCEDURES  Procedure(s) performed: None  Critical Care performed: None  ____________________________________________   ED COURSE / ASSESSMENT AND PLAN  Pertinent labs & imaging results that were available during my care of the patient were reviewed by me and considered in my medical decision making (see chart for details).   After pressure dressing and clamp applied, patient watched for an hour or so here before I took down the dressing to take a look at the fistula itself. There is no additional active bleeding. I did go ahead and leave it intact the dried blood gauze with tape, because I did not want to potentially pull off a scab and reinitiate bleeding.  I provided additional gauze with wrap and then an Ace wrap relatively loose, but to provide a little extra compression tonight.  Her blood pressure is elevated, but she hasn't had her evening doses. Patient will be given her nighttime medications here as she requested this.  She does wear home oxygen, and she is waiting for ride to bring her portable tank.    CONSULTATIONS:   None   Patient / Family / Caregiver informed of clinical course, medical decision-making process, and agree with plan.   I discussed return precautions, follow-up instructions, and discharged instructions with patient and/or  family.   ___________________________________________   FINAL CLINICAL IMPRESSION(S) / ED DIAGNOSES   Final diagnoses:  Complication of AV dialysis fistula, initial encounter              Note: This dictation was prepared with Dragon dictation. Any transcriptional errors that result from this process are unintentional   Governor Rooks, MD 07/06/15 [phone]

## 2015-07-06 NOTE — ED Notes (Signed)
Dr. Mel Almond Lord at bedside at this time.

## 2015-07-06 NOTE — ED Notes (Addendum)
Pt stating she is wanting to leave at this time. Pt states she has still not seen a Dr. And that her chronic pain is getting worse because it is time to take her home medications. Pt pacing about the room and refuses to sit down even after being advised of risk of falling. Dr. Shaune PollackLord notified.

## 2015-07-06 NOTE — ED Notes (Signed)
Pt to ED via EMS from home c/o bleeding to left arm today.  Pt states was at dialysis today at 10am but stopped early around noon.  Pt discharged home and noticed bleeding at this time.  Fistula wrapped by EMS, bleeding under control, and clamp placed at this time.  Pt is A&Ox4, speaking in complete and coherent sentences and in NAD at this time.

## 2015-10-13 ENCOUNTER — Ambulatory Visit: Payer: Medicare Other | Attending: Geriatric Medicine | Admitting: Physical Therapy

## 2015-10-13 DIAGNOSIS — R262 Difficulty in walking, not elsewhere classified: Secondary | ICD-10-CM

## 2015-10-13 DIAGNOSIS — M546 Pain in thoracic spine: Secondary | ICD-10-CM

## 2015-10-13 NOTE — Therapy (Signed)
[facility] PHYS[facility] SPORTS MEDICINE [address] [place], [address] Phone: [phone]   Fax:  [phone]  Physical Therapy Evaluation  Patient Details  Name: MINTIE SHANKER MRN: [id number] Date of Birth: 03/05/1956 No Data Recorded  Encounter Date: 10/13/2015      PT End of Session - 10/13/15 1430    Visit Number 1   Number of Visits 9   Date for PT Re-Evaluation 12/15/15   PT Start Time 1326   PT Stop Time 1404   PT Time Calculation (min) 38 min   Equipment Utilized During Treatment Oxygen  2L   Activity Tolerance Patient tolerated treatment well   Behavior During Therapy [facility] for tasks assessed/performed      Past Medical History:  Diagnosis Date  . COPD (chronic obstructive pulmonary disease) (HCC)   . Hypertension   . Renal disorder     Past Surgical History:  Procedure Laterality Date  . CHOLECYSTECTOMY      There were no vitals filed for this visit.       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 Reports she used to pick up heavy boxes. Pain radiates to front of left side (rib cage). Has not had any formal PT, does take Tramadol/Tylenol PRN.    Pertinent History She is on chronic O2, on dialysis.    Limitations Standing;Walking;Lifting;Sitting   Patient Stated Goals To reduce her pain and become more functional.    Currently in Pain? Yes   Pain Score 4    Pain Location Back   Pain Orientation Posterior;Mid   Pain Descriptors / Indicators Constant   Pain Type Chronic pain   Pain Radiating Towards Has separate pain in LUQ (chronic)    Pain Onset More than a month ago   Pain Frequency Constant   Aggravating Factors  Leaning forwards   Pain Relieving Factors Pillows behind back/ice.             [facility] PT Assessment - 10/13/15 1436      Assessment   Medical Diagnosis --  Radicular pain  of thoracic region   Referring Provider --  Charlaine Dalton MD     Precautions   Precaution Comments --  Spinal precautions due to documented compression fx     Restrictions   Weight Bearing Restrictions No     Balance Screen   Has the patient fallen in the past 6 months No     Prior Function   Level of Independence Independent   Leisure --  Goes to dialysis MWF, sedentary at home     Cognition   Overall Cognitive Status Within Functional Limits for tasks assessed     Observation/Other Assessments   Modified [place] 30     Sensation   Light Touch Appears Intact     Sit to Stand   Comments --  Able to complete without use of UEs     Posture/Postural Control   Posture/Postural Control Postural limitations   Postural Limitations Rounded Shoulders;Forward head;Increased thoracic kyphosis;Flexed trunk     Standardized Balance Assessment   Five times sit to stand comments  29.88   10 Meter Walk 12.93       Attempted supine bridging which was deemed to be uncomfortable, also uncomfortable with pillow under spine so dc'd.   Seated yellow t-band rows x 8 with cuing for  upright posture (no added pain)   Standing hip abductions x 8 with bilateral HHA (felt appropriately)   Cuing for pillows under hips and behind back in sitting and to have O2 tank by her side in gait to promote more extended thoracic spine position.                    PT Education - 10/13/15 1425    Education provided Yes   Education Details Compression fractures are a result of increased contact pressure on anterior vertebrae. Relief requires decreased pressure (more erect posture) using pillows or musculature for support.    Person(s) Educated Patient   Methods Explanation;Demonstration;Handout   Comprehension Verbalized understanding;Returned demonstration;Need further instruction             PT Long Term Goals - 10/13/15 1441      PT LONG TERM GOAL #1   Title Patient will  complete 73m walk in under 10 seconds to demonstrate appropriate speed for community mobility.    Baseline 12.93 seconds   Time 8   Period Weeks   Status New     PT LONG TERM GOAL #2   Title Patient will report worst VAS pain score of less than 4/10 to demonstrate improved tolerance for ADLs.    Baseline 10/10   Time 8   Period Weeks   Status New     PT LONG TERM GOAL #3   Title Patient will complete 5x sit to stand in less than 14 seconds to demonstrate appropriate LE strength and power to support torso.    Baseline 29.88 seconds w/o use of UEs.    Time 8   Period Weeks   Status New     PT LONG TERM GOAL #4   Title Patient will report ODI score of less than 20% disability to demonstrate improved tolerance for ADLs.    Baseline 30%    Time 8   Period Weeks   Status New               Plan - 10/13/15 1431    Clinical Impression Statement Patient reports long history of mid thoracic spine pain, given her posture it is likely she has increased contact pressure in anterior vertebrae leading to compression fx. She reports pain is worst with flexed activities, moderate relief with more extension based activities. She demonstrates kyphotic t-spine posture in sitting and standing, cued to use pillows and/or O2 tank to maintain more vertical position to increase contact area of vertebrae. She would benefit from skilled PT services to address her postural deficits and reduce her symptoms.    Rehab Potential Good   Clinical Impairments Affecting Rehab Potential Chronicity of fractures, osteoporosis, sedentary lifestyle.    PT Frequency 1x / week   PT Duration 8 weeks   PT Treatment/Interventions Aquatic Therapy;Biofeedback;Therapeutic exercise;Therapeutic activities;Taping;Manual techniques;Gait training   PT Next Visit Plan Peri-scapular musculature strengthening, gluteal/LE strengthening, postural awareness (can try taping)   PT Home Exercise Plan Sit to stands w/o hands, standing  hip abductions, seated mid rows.    Consulted and Agree with Plan of Care Patient      Patient will benefit from skilled therapeutic intervention in order to improve the following deficits and impairments:  Abnormal gait, Improper body mechanics, Pain, Postural dysfunction, Difficulty walking, Decreased strength  Visit Diagnosis: Difficulty in walking, not elsewhere classified - Plan: PT plan of care cert/re-cert  Pain in thoracic spine - Plan: PT plan of care cert/re-cert  G-Codes - 10/13/15 1444    Functional Assessment Tool Used Modified ODI, 75m walk, 5x sit to stand    Functional Limitation Mobility: Walking and moving around   Mobility: Walking and Moving Around Current Status [phone]) At least 20 percent but less than 40 percent impaired, limited or restricted   Mobility: Walking and Moving Around Goal Status [phone]) At least 1 percent but less than 20 percent impaired, limited or restricted       Problem List There are no active problems to display for this patient.  Kerin Ransom, PT, DPT    10/13/2015, 2:46 PM  [facility] PHYSICAL AND SPORTS MEDICINE [address] [place], [address] Phone: [phone]   Fax:  [phone]  Name: RUTHER EPHRAIM MRN: [id number] Date of Birth: Sep 01, 1955

## 2015-10-15 ENCOUNTER — Encounter: Payer: Medicare Other | Admitting: Physical Therapy

## 2015-10-20 ENCOUNTER — Encounter: Payer: Self-pay | Admitting: Physical Therapy

## 2015-10-20 ENCOUNTER — Ambulatory Visit: Payer: Medicare Other

## 2015-10-20 ENCOUNTER — Encounter: Payer: Medicare Other | Admitting: Physical Therapy

## 2015-10-20 DIAGNOSIS — R262 Difficulty in walking, not elsewhere classified: Secondary | ICD-10-CM

## 2015-10-20 DIAGNOSIS — M546 Pain in thoracic spine: Secondary | ICD-10-CM

## 2015-10-20 NOTE — Progress Notes (Deleted)
Patient reports she has chronic compression fx of T8-12, has had chronic back pain for roughly 10 years. Reports she used to pick up heavy boxes. Pain radiates to front of left side (rib cage). Has not had any formal PT, does take Tramadol/Tylenol PRN. Attempted supine bridging which was deemed to be uncomfortable, also uncomfortable with pillow under spine so dc'd.    Seated yellow t-band rows x 8 with cuing for upright posture (no added pain)    Standing hip abductions x 8 with bilateral HHA (felt appropriately)   Cuing for pillows under hips and behind back in sitting and to have O2 tank by her side in gait to promote more extended thoracic spine position.

## 2015-10-20 NOTE — Therapy (Signed)
[facility] [address] [place], [place], [address] Phone: [phone]   Fax:  [phone]  Physical Therapy Treatment  Patient Details  Name: Katie LisLaura M Larsen MRN: [id number] Date of Birth: 20-Jul-1955 No Data Recorded  Encounter Date: 10/20/2015      PT End of Session - 10/20/15 1355    Visit Number 2   Number of Visits 9   Date for PT Re-Evaluation 12/15/15   PT Start Time 1310   PT Stop Time 1350   PT Time Calculation (min) 40 min   Equipment Utilized During Treatment Oxygen  1.5 L   Activity Tolerance Patient limited by fatigue   Behavior During Therapy [facility] for tasks assessed/performed      Past Medical History:  Diagnosis Date  . COPD (chronic obstructive pulmonary disease) (HCC)   . Hypertension   . Renal disorder     Past Surgical History:  Procedure Laterality Date  . CHOLECYSTECTOMY      There were no vitals filed for this visit.   TREATMENT  Standing yellow t-band shoulder extension 3 x 10 with cuing for upright posture (no added pain)   Standing hip abductions, marching, extension 2# ankle x 10 each with UE assist;  Standing knee flexion 2# ankle x 10 each with UE assist;  Sidestepping with 2# ankle weigths x 1 minute;  Physioball seated marches with 2# ankle weights;  Seated chest press with therapist resistance and cues to avoid rounding back;  Multiple seated rest breaks provided for patient due to fatigue. Pt wears supplemental O2 at 1.5 L/min throughout session. Intermittently checked SaO2 during session and always above 90%.                              PT Education - 10/20/15 1318    Education provided Yes   Education Details HEP revised   Person(s) Educated Patient   Methods Explanation   Comprehension Verbalized understanding             PT Long Term Goals - 10/13/15 1441      PT LONG TERM GOAL #1   Title Patient will complete 2554m walk  in under 10 seconds to demonstrate appropriate speed for community mobility.    Baseline 12.93 seconds   Time 8   Period Weeks   Status New     PT LONG TERM GOAL #2   Title Patient will report worst VAS pain score of less than 4/10 to demonstrate improved tolerance for ADLs.    Baseline 10/10   Time 8   Period Weeks   Status New     PT LONG TERM GOAL #3   Title Patient will complete 5x sit to stand in less than 14 seconds to demonstrate appropriate LE strength and power to support torso.    Baseline 29.88 seconds w/o use of UEs.    Time 8   Period Weeks   Status New     PT LONG TERM GOAL #4   Title Patient will report ODI score of less than 20% disability to demonstrate improved tolerance for ADLs.    Baseline 30%    Time 8   Period Weeks   Status New               Plan - 10/20/15 1356    Clinical Impression Statement Modified rows as pt is concerned about LUE fistula. She is able  to complete all exercises as instructed by therapist today without increase in pain. Pt is fatigued and requires multiple seated rest breaks. Supplemental O2 donned throughout session and SaO2 remains at or above 90% throughout entire session. Pt encouraged to follow-up as scheduled. Educational material provided to patient regarding [facility], pulmonary rehab program at [facility].    Rehab Potential Good   Clinical Impairments Affecting Rehab Potential Chronicity of fractures, osteoporosis, sedentary lifestyle.    PT Frequency 1x / week   PT Duration 8 weeks   PT Treatment/Interventions Aquatic Therapy;Biofeedback;Therapeutic exercise;Therapeutic activities;Taping;Manual techniques;Gait training   PT Next Visit Plan Peri-scapular musculature strengthening, gluteal/LE strengthening, postural awareness (can try taping)   PT Home Exercise Plan Sit to stands w/o hands, standing hip abductions,   Consulted and Agree with Plan of Care Patient      Patient will benefit from skilled therapeutic  intervention in order to improve the following deficits and impairments:  Abnormal gait, Improper body mechanics, Pain, Postural dysfunction, Difficulty walking, Decreased strength  Visit Diagnosis: Difficulty in walking, not elsewhere classified  Pain in thoracic spine     Problem List There are no active problems to display for this patient.  Lynnea Maizes PT, DPT   Huprich,Jason 10/20/2015, 1:58 PM  [facility] PHYSICAL AND SPORTS MEDICINE [address], [place], [address] Phone: [phone]   Fax:  [phone]  Name: Katie Larsen MRN: [id number] Date of Birth: Apr 23, 1955

## 2015-10-22 ENCOUNTER — Encounter: Payer: Medicare Other | Admitting: Physical Therapy

## 2015-10-27 ENCOUNTER — Ambulatory Visit: Payer: Medicare Other | Admitting: Physical Therapy

## 2015-10-27 DIAGNOSIS — R262 Difficulty in walking, not elsewhere classified: Secondary | ICD-10-CM

## 2015-10-27 DIAGNOSIS — M546 Pain in thoracic spine: Secondary | ICD-10-CM

## 2015-10-27 NOTE — Patient Instructions (Addendum)
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 X 4 for subsequent attempts for 3" holds 2 sets total   Leg Press x 20# for 12 repetitions from nearest seat position x 2 sets (challenging, but not terrible)   Seated marching on grey physio ball x 8 repetitions

## 2015-10-27 NOTE — Therapy (Signed)
[place] [facility] [address] [place], [place], [address] Phone: [phone]   Fax:  [phone]  Physical Therapy Treatment  Patient Details  Name: Katie Larsen MRN: [id number] Date of Birth: 08/19/1955 No Data Recorded  Encounter Date: 10/27/2015      PT End of Session - 10/27/15 1402    Visit Number 3   Number of Visits 9   Date for PT Re-Evaluation 12/15/15   PT Start Time 1307   PT Stop Time 1400   PT Time Calculation (min) 53 min   Equipment Utilized During Treatment Oxygen   Activity Tolerance Patient tolerated treatment well   Behavior During Therapy [facility] tasks assessed/performed      Past Medical History:  Diagnosis Date  . COPD (chronic obstructive pulmonary disease) (HCC)   . Hypertension   . Renal disorder     Past Surgical History:  Procedure Laterality Date  . CHOLECYSTECTOMY      There were no vitals filed for this visit.      Subjective Assessment - 10/27/15 1310    Subjective Patient reports she is having less back pain than before her evaluation, she is reporting she is compliant with HEP particularly LE exercises.    Pertinent History She is on chronic O2, on dialysis.    Limitations Standing;Walking;Lifting;Sitting   Patient Stated Goals To reduce her pain and become more functional.    Currently in Pain? Yes   Pain Score 2    Pain Location Back   Pain Orientation Mid;Posterior   Pain Descriptors / Indicators Aching   Pain Type Chronic pain   Pain Onset More than a month ago   Pain Frequency Constant   Aggravating Factors  Leaning forwards.   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 X 4 for subsequent attempts for 3" holds 2 sets total   Leg Press x 20# for 12 repetitions from nearest seat position x 2 sets (challenging, but not terrible)   Seated marching on grey physio ball x 8 repetitions                            PT Education - 10/27/15 1404    Education provided Yes   Education Details Will discuss LUQ abdominal pain with MD.    [place] JettyPerson(s) Educated Patient   Methods Explanation   Comprehension Verbalized understanding             PT Long Term Goals - 10/13/15 1441      PT LONG TERM GOAL #1   Title Patient will complete 5063m walk in under 10 seconds to demonstrate appropriate speed for community mobility.    Baseline 12.93 seconds   Time 8   Period Weeks   Status New     PT LONG TERM GOAL #  2   Title Patient will report worst VAS pain score of less than 4/10 to demonstrate improved tolerance for ADLs.    Baseline 10/10   Time 8   Period Weeks   Status New     PT LONG TERM GOAL #3   Title Patient will complete 5x sit to stand in less than 14 seconds to demonstrate appropriate LE strength and power to support torso.    Baseline 29.88 seconds w/o use of UEs.    Time 8   Period Weeks   Status New     PT LONG TERM GOAL #4   Title Patient will report ODI score of less than 20% disability to demonstrate improved tolerance for ADLs.    Baseline 30%    Time 8   Period Weeks   Status New               Plan - 10/27/15 1350    Clinical Impression Statement Patient is able to perform 5x sit to stand in markedly improved time, mild improvement in 5431m walk times as well. She demonstrates poor anti-rotation strength in palloff press, difficulty with stabilization of pelvic/trunk posture in seated hip flexion on physio  ball, will require additional work on this in subsequent tx sessions.    Rehab Potential Good   Clinical Impairments Affecting Rehab Potential Chronicity of fractures, osteoporosis, sedentary lifestyle.    PT Frequency 1x / week   PT Duration 8 weeks   PT Treatment/Interventions Aquatic Therapy;Biofeedback;Therapeutic exercise;Therapeutic activities;Taping;Manual techniques;Gait training   PT Next Visit Plan Peri-scapular musculature strengthening, gluteal/LE strengthening, postural awareness (can try taping)   PT Home Exercise Plan Sit to stands w/o hands, standing hip abductions,   Consulted and Agree with Plan of Care Patient      Patient will benefit from skilled therapeutic intervention in order to improve the following deficits and impairments:  Abnormal gait, Improper body mechanics, Pain, Postural dysfunction, Difficulty walking, Decreased strength  Visit Diagnosis: Difficulty in walking, not elsewhere classified  Pain in thoracic spine     Problem List There are no active problems to display for this patient.  Kerin RansomPatrick A McNamara, PT, DPT    10/27/2015, 10:48 PM  [facility] [address] [place], [place], [address] Phone: [phone]   Fax:  [phone]  Name: Katie Larsen MRN: [id number] Date of Birth: 1955-09-20

## 2015-11-03 ENCOUNTER — Ambulatory Visit: Payer: Medicare Other | Admitting: Physical Therapy

## 2015-11-03 DIAGNOSIS — R262 Difficulty in walking, not elsewhere classified: Secondary | ICD-10-CM | POA: Diagnosis not present

## 2015-11-03 DIAGNOSIS — M546 Pain in thoracic spine: Secondary | ICD-10-CM

## 2015-11-03 NOTE — Patient Instructions (Addendum)
Leg Press 25# x 10, 35# x10, 55# x 8 (from nearest position)   Standing low rows with red t-band x 10, x 15  Standing chops from R to L with red t-band x 10  -- L to R x 10    O2 sats at 91% initially on 1.5 L O2, increased to 2L for exercise -- (she is normally around 90-92% at baseline).   On clear physio ball OH ball hold (4#) with marching on LEs x 5 bilaterally -- completed with 2# contralateral LE marching and contralateral UE shoulder press x 7 per side (became quite fatigued).   5x sit to stand 13.85 seconds   X6, x8 repetitions of sit to stand with 4# weight   Straight arm pulldowns x 10# for 15 reps, x 15# x6 repetitions

## 2015-11-03 NOTE — Therapy (Signed)
[facility] [address] [place], [place], [address] Phone: [phone]   Fax:  [phone]  Physical Therapy Treatment  Patient Details  Name: Katie LisLaura M Asman MRN: [id number] Date of Birth: 1955/06/25 No Data Recorded  Encounter Date: 11/03/2015      PT End of Session - 11/03/15 1356    Visit Number 4   Number of Visits 9   Date for PT Re-Evaluation 12/15/15   PT Start Time 1306   PT Stop Time 1354   PT Time Calculation (min) 48 min   Equipment Utilized During Treatment Oxygen   Activity Tolerance Patient tolerated treatment well   Behavior During Therapy [facility] for tasks assessed/performed      Past Medical History:  Diagnosis Date  . COPD (chronic obstructive pulmonary disease) (HCC)   . Hypertension   . Renal disorder     Past Surgical History:  Procedure Laterality Date  . CHOLECYSTECTOMY      There were no vitals filed for this visit.      Subjective Assessment - 11/03/15 1314    Subjective Patient reports she is having a bad day, increased back pain from the past few days. She reports most of last week she really wasn't having any pain. She has done her HEP every day except for yesterday.    Pertinent History She is on chronic O2, on dialysis.    Limitations Standing;Walking;Lifting;Sitting   Patient Stated Goals To reduce her pain and become more functional.    Currently in Pain? Yes   Pain Score --  Patient reports she is having more pain in her back today   Pain Location Back   Pain Orientation Mid;Posterior   Pain Descriptors / Indicators Aching   Pain Radiating Towards Has separate pain in LUQ (Chronic)    Pain Onset More than a month ago   Pain Frequency Intermittent   Aggravating Factors  Leaning forwards    Pain Relieving Factors Ice, pillows behind back       Leg Press 25# x 10, 35# x10, 55# x 8 (from nearest position)   Standing low rows with red t-band x 10, x 15  Standing  chops from R to L with red t-band x 10  -- L to R x 10    O2 sats at 91% initially on 1.5 L O2, increased to 2L for exercise -- (she is normally around 90-92% at baseline).   On clear physio ball OH ball hold (4#) with marching on LEs x 5 bilaterally -- completed with 2# contralateral LE marching and contralateral UE shoulder press x 7 per side (became quite fatigued).   5x sit to stand 13.85 seconds   X6, x8 repetitions of sit to stand with 4# weight   Straight arm pulldowns x 10# for 15 reps, x 15# x6 repetitions                             PT Education - 11/03/15 1356    Education provided Yes   Education Details Add weight to sit to stands. :Progress with sit to stands.    Person(s) Educated Patient   Methods Explanation;Demonstration   Comprehension Verbalized understanding;Returned demonstration             PT Long Term Goals - 10/13/15 1441      PT LONG TERM GOAL #1   Title Patient will complete 3533m walk in under  10 seconds to demonstrate appropriate speed for community mobility.    Baseline 12.93 seconds   Time 8   Period Weeks   Status New     PT LONG TERM GOAL #2   Title Patient will report worst VAS pain score of less than 4/10 to demonstrate improved tolerance for ADLs.    Baseline 10/10   Time 8   Period Weeks   Status New     PT LONG TERM GOAL #3   Title Patient will complete 5x sit to stand in less than 14 seconds to demonstrate appropriate LE strength and power to support torso.    Baseline 29.88 seconds w/o use of UEs.    Time 8   Period Weeks   Status New     PT LONG TERM GOAL #4   Title Patient will report ODI score of less than 20% disability to demonstrate improved tolerance for ADLs.    Baseline 30%    Time 8   Period Weeks   Status New               Plan - 11/03/15 1352    Clinical Impression Statement Patient reports she has been feeling better most days, significant improvement in 5x sit to stand in  this session as well as significant increase in weight tolerated for LE exercises provided in this session. Patient will continue to benefit from posterior chain strengthening, may attempt deadlift exercise in follow up session.    Rehab Potential Good   Clinical Impairments Affecting Rehab Potential Chronicity of fractures, osteoporosis, sedentary lifestyle.    PT Frequency 1x / week   PT Duration 8 weeks   PT Treatment/Interventions Aquatic Therapy;Biofeedback;Therapeutic exercise;Therapeutic activities;Taping;Manual techniques;Gait training   PT Next Visit Plan Peri-scapular musculature strengthening, gluteal/LE strengthening, postural awareness (can try taping)   PT Home Exercise Plan Sit to stands w/o hands, standing hip abductions,   Consulted and Agree with Plan of Care Patient      Patient will benefit from skilled therapeutic intervention in order to improve the following deficits and impairments:  Abnormal gait, Improper body mechanics, Pain, Postural dysfunction, Difficulty walking, Decreased strength  Visit Diagnosis: Difficulty in walking, not elsewhere classified  Pain in thoracic spine     Problem List There are no active problems to display for this patient.  Kerin RansomPatrick A McNamara, PT, DPT    11/03/2015, 2:04 PM  [facility] [address] , [place], [address] Phone: [phone]   Fax:  [phone]  Name: Katie LisLaura M Sweatman MRN: [id number] Date of Birth: August 25, 1955

## 2015-11-10 ENCOUNTER — Ambulatory Visit: Payer: Medicare Other | Admitting: Physical Therapy

## 2015-11-10 DIAGNOSIS — R262 Difficulty in walking, not elsewhere classified: Secondary | ICD-10-CM

## 2015-11-10 DIAGNOSIS — M546 Pain in thoracic spine: Secondary | ICD-10-CM

## 2015-11-10 NOTE — Therapy (Signed)
[facility] [facility]IC[facility] [address] [place], [place], [address] Phone: [phone]   Fax:  [phone]  Physical Therapy Treatment  Patient Details  Name: Katie Larsen MRN: [id number] Date of Birth: [birth year] No Data Recorded  Encounter Date: 11/10/2015      PT End of Session - 11/10/15 1330    Visit Number 5   Number of Visits 9   Date for PT Re-Evaluation 12/15/15   PT Start Time 1307   PT Stop Time 1400   PT Time Calculation (min) 53 min   Equipment Utilized During Treatment Oxygen   Activity Tolerance Patient tolerated treatment well   Behavior During Therapy [facility] for tasks assessed/performed      Past Medical History:  Diagnosis Date  . COPD (chronic obstructive pulmonary disease) (HCC)   . Hypertension   . Renal disorder     Past Surgical History:  Procedure Laterality Date  . CHOLECYSTECTOMY      There were no vitals filed for this visit.      Subjective Assessment - 11/10/15 1328    Subjective Pt reports being compliant with HEP and states that the new exercises given to her went well over the weekend.   Pertinent History She is on chronic O2, on dialysis.    Limitations Standing;Walking;Lifting;Sitting   Patient Stated Goals To reduce her pain and become more functional.    Currently in Pain? Yes   Pain Score 2    Pain Location Back   Pain Orientation Mid;Posterior   Pain Onset More than a month ago      bil SLS with Pallof press on airex, 1x10 each; mod verbal cues for proper technique; CGA for balance, pt expressed frustration with exercise stating "this is messing up my balance" so dc'd activity  Single leg sit <> stand from elevated surface with no UE support, 1x3 with RLE; pt unsteady and expressed she wished to discontinue exercise stating "this is messing up my balance"  Bil bridging with 5 sec hold, x5, x10; min verbal cues for proper technique  Bil single leg bridging with 5 sec hold,  2x5 each; min verbal cues for proper technique  Leg press x 45#, 3x10 with pillow behind back; initially put pt on leg press with 55# but increased back pain so lowered weight which decreased symptoms  Mini squats with 5# DB, 2x10; min verbal cues for proper technique   Fwd/retro resisted walking with GTB, x2 laps each; CGA for retro walking, min verbal cues for   Ambulating up/down stairs x3, min cues to have reciprocal pattern on descent; x 5 with 2# cuff weights on each ankle, with no UE support throughout all; SBA, pt had good control throughout      PT Education - 11/10/15 1329    Education provided Yes   Education Details exercise technique, HEP   Person(s) Educated Patient   Methods Explanation   Comprehension Verbalized understanding             PT Long Term Goals - 10/13/15 1441      PT LONG TERM GOAL #1   Title Patient will complete 4736m walk in under 10 seconds to demonstrate appropriate speed for community mobility.    Baseline 12.93 seconds   Time 8   Period Weeks   Status New     PT LONG TERM GOAL #2   Title Patient will report worst VAS pain score of less than 4/10 to demonstrate improved  tolerance for ADLs.    Baseline 10/10   Time 8   Period Weeks   Status New     PT LONG TERM GOAL #3   Title Patient will complete 5x sit to stand in less than 14 seconds to demonstrate appropriate LE strength and power to support torso.    Baseline 29.88 seconds w/o use of UEs.    Time 8   Period Weeks   Status New     PT LONG TERM GOAL #4   Title Patient will report ODI score of less than 20% disability to demonstrate improved tolerance for ADLs.    Baseline 30%    Time 8   Period Weeks   Status New               Plan - 11/10/15 1658    Clinical Impression Statement Pt presented to therapy today in irritable mood and verbalized frustration with exercises that were initially given during the session. She kept stating "I've got stuff to do today. These  are messing up my balance. I don't want to do these exercises anymore." PT discuessed with pt the rationale behind selected balance/strengthening exercises but educated pt that we did not have to do them and we could focus more on strengthening this session. Pt verbalized understanding and chose to continue therapy. After a few minutes, pt mood improved and she was able to perform all strengthening exercises without increase in back pain. Pt needs continued skilled PT intervention to maximize overall function and strength.   Rehab Potential Good   Clinical Impairments Affecting Rehab Potential Chronicity of fractures, osteoporosis, sedentary lifestyle.    PT Frequency 1x / week   PT Duration 8 weeks   PT Treatment/Interventions Aquatic Therapy;Biofeedback;Therapeutic exercise;Therapeutic activities;Taping;Manual techniques;Gait training   PT Next Visit Plan Peri-scapular musculature strengthening, gluteal/LE strengthening, postural awareness (can try taping)   PT Home Exercise Plan Sit to stands w/o hands, standing hip abductions,   Consulted and Agree with Plan of Care Patient      Patient will benefit from skilled therapeutic intervention in order to improve the following deficits and impairments:  Abnormal gait, Improper body mechanics, Pain, Postural dysfunction, Difficulty walking, Decreased strength  Visit Diagnosis: Difficulty in walking, not elsewhere classified  Pain in thoracic spine     Problem List There are no active problems to display for this patient.  Jac Canavan, SPT  Jac Canavan 11/10/2015, 5:03 PM  [facility] PHYSICAL AND SPORTS MEDICINE [address], [place], [address] Phone: [phone]   Fax:  [phone]  Name: Katie Larsen MRN: [id number] Date of Birth: 1955/06/21

## 2015-11-16 ENCOUNTER — Other Ambulatory Visit
Admission: RE | Admit: 2015-11-16 | Discharge: 2015-11-16 | Disposition: A | Discharge status: Home / Self Care | Payer: Medicare Other | Source: Ambulatory Visit | Attending: Nephrology | Admitting: Nephrology

## 2015-11-16 LAB — POTASSIUM: POTASSIUM: 4.9 mmol/L (ref 3.5–5.1)

## 2015-11-17 ENCOUNTER — Ambulatory Visit: Payer: Medicare Other | Admitting: Physical Therapy

## 2015-11-24 ENCOUNTER — Ambulatory Visit: Payer: Medicare Other | Admitting: Physical Therapy

## 2015-12-01 ENCOUNTER — Ambulatory Visit: Payer: Medicare Other | Attending: Geriatric Medicine | Admitting: Physical Therapy

## 2015-12-01 DIAGNOSIS — R262 Difficulty in walking, not elsewhere classified: Secondary | ICD-10-CM | POA: Insufficient documentation

## 2015-12-01 DIAGNOSIS — M546 Pain in thoracic spine: Secondary | ICD-10-CM | POA: Insufficient documentation

## 2015-12-01 NOTE — Therapy (Signed)
[facility] [address] [place], [place], [address] Phone: [phone]   Fax:  [phone]  Physical Therapy Treatment  Patient Details  Name: Katie Larsen MRN: [id number] Date of Birth: 18-Mar-1955 No Data Recorded  Encounter Date: 12/01/2015      PT End of Session - 12/01/15 1334    Visit Number 6   Number of Visits 9   Date for PT Re-Evaluation 12/15/15   PT Start Time 1304   PT Stop Time 1400   PT Time Calculation (min) 56 min   Equipment Utilized During Treatment Oxygen   Activity Tolerance Patient tolerated treatment well   Behavior During Therapy [facility] for tasks assessed/performed      Past Medical History:  Diagnosis Date  . COPD (chronic obstructive pulmonary disease) (HCC)   . Hypertension   . Renal disorder     Past Surgical History:  Procedure Laterality Date  . CHOLECYSTECTOMY      There were no vitals filed for this visit.      Subjective Assessment - 12/01/15 1307    Subjective pt reports her knee is feeling better and that her HEP at home is still somewhat challenging. Otherwise she is doing well.   Pertinent History She is on chronic O2, on dialysis.    Limitations Standing;Walking;Lifting;Sitting   Patient Stated Goals To reduce her pain and become more functional.    Currently in Pain? Yes   Pain Score --  mild pain rating   Pain Location Back   Pain Orientation Mid   Pain Onset More than a month ago     Lifting box from floor, 3x5 with no weight in the box  Seated thoracic extension over towel roll, 2x10; min verbal cues for proper techinque  Leg press x 35#, 3x10  Bil single-leg bridging, 3x5 each; min verbal cues for proper technique   Dead bugs 1x15 each; mod verbal cues for proper technique  Bil staggered feet (heel on the ground) while seated balance on physioball with ball toss, 3x10 each; CGA for balance      PT Long Term Goals - 10/13/15 1441      PT LONG  TERM GOAL #1   Title Patient will complete 3133m walk in under 10 seconds to demonstrate appropriate speed for community mobility.    Baseline 12.93 seconds   Time 8   Period Weeks   Status New     PT LONG TERM GOAL #2   Title Patient will report worst VAS pain score of less than 4/10 to demonstrate improved tolerance for ADLs.    Baseline 10/10   Time 8   Period Weeks   Status New     PT LONG TERM GOAL #3   Title Patient will complete 5x sit to stand in less than 14 seconds to demonstrate appropriate LE strength and power to support torso.    Baseline 29.88 seconds w/o use of UEs.    Time 8   Period Weeks   Status New     PT LONG TERM GOAL #4   Title Patient will report ODI score of less than 20% disability to demonstrate improved tolerance for ADLs.    Baseline 30%    Time 8   Period Weeks   Status New               Plan - 12/01/15 1401    Clinical Impression Statement Pt is making progress towards goals. Pt reported  that her HEP is still challenging and that she feels as though her BLE are getting stronger. She verbalized desire to learn proper lifting techniques, which was addressed this date. Pt core strength continues to be limited, which was also addressed during session and pt tolerated exercises well. Pt on 1.5-2.0 L O2 throughout entire session. Pt needs continued skilled PT intervention to maximize overall strength and function.   Rehab Potential Good   Clinical Impairments Affecting Rehab Potential Chronicity of fractures, osteoporosis, sedentary lifestyle.    PT Frequency 1x / week   PT Duration 8 weeks   PT Treatment/Interventions Aquatic Therapy;Biofeedback;Therapeutic exercise;Therapeutic activities;Taping;Manual techniques;Gait training   PT Next Visit Plan Peri-scapular musculature strengthening, gluteal/LE strengthening, postural awareness (can try taping)   PT Home Exercise Plan Sit to stands w/o hands, standing hip abductions,   Consulted and Agree  with Plan of Care Patient      Patient will benefit from skilled therapeutic intervention in order to improve the following deficits and impairments:  Abnormal gait, Improper body mechanics, Pain, Postural dysfunction, Difficulty walking, Decreased strength  Visit Diagnosis: Difficulty in walking, not elsewhere classified  Pain in thoracic spine     Problem List There are no active problems to display for this patient.  Jac Canavan, SPT  Jac Canavan 12/01/2015, 2:05 PM  [facility] PHYSICAL AND SPORTS MEDICINE [address], [place], [address] Phone: [phone]   Fax:  [phone]  Name: Katie Larsen MRN: [id number] Date of Birth: 1956/02/22

## 2015-12-07 ENCOUNTER — Other Ambulatory Visit
Admission: RE | Admit: 2015-12-07 | Discharge: 2015-12-07 | Disposition: A | Discharge status: Home / Self Care | Payer: Medicare Other | Source: Ambulatory Visit | Attending: Nephrology | Admitting: Nephrology

## 2015-12-07 DIAGNOSIS — E875 Hyperkalemia: Secondary | ICD-10-CM | POA: Insufficient documentation

## 2015-12-07 LAB — POTASSIUM: POTASSIUM: 7 mmol/L — AB (ref 3.5–5.1)

## 2015-12-08 ENCOUNTER — Ambulatory Visit: Payer: Medicare Other | Admitting: Physical Therapy

## 2015-12-08 DIAGNOSIS — R262 Difficulty in walking, not elsewhere classified: Secondary | ICD-10-CM | POA: Diagnosis not present

## 2015-12-08 DIAGNOSIS — M546 Pain in thoracic spine: Secondary | ICD-10-CM

## 2015-12-08 NOTE — Therapy (Signed)
[facility] [address] [place], [place], [address] Phone: [phone]   Fax:  [phone]  Physical Therapy Treatment  Patient Details  Name: Katie Larsen MRN: [id number] Date of Birth: 15-Jun-1955 No Data Recorded  Encounter Date: 12/08/2015      PT End of Session - 12/08/15 1303    Visit Number 7   Number of Visits 9   Date for PT Re-Evaluation 12/15/15   PT Start Time 1300   PT Stop Time 1400   PT Time Calculation (min) 60 min   Equipment Utilized During Treatment Oxygen   Activity Tolerance Patient tolerated treatment well   Behavior During Therapy [facility] for tasks assessed/performed      Past Medical History:  Diagnosis Date  . COPD (chronic obstructive pulmonary disease) (HCC)   . Hypertension   . Renal disorder     Past Surgical History:  Procedure Laterality Date  . CHOLECYSTECTOMY      There were no vitals filed for this visit.      Subjective Assessment - 12/08/15 1302    Subjective Pt states she feels good today. When asked about pain she said "a little bit like the usual."   Pertinent History She is on chronic O2, on dialysis.    Limitations Standing;Walking;Lifting;Sitting   Patient Stated Goals To reduce her pain and become more functional.    Currently in Pain? Yes   Pain Score --  mild   Pain Location Back   Pain Orientation Mid   Pain Onset More than a month ago     Lifting box (no weight inside; ~5#), 2x5; pt reported this as challenging and demonstrated carry-over with proper lifting technique   Leg press 45#, 3x10  Sit <> stand with 6# DB, 2x10  Sitting on physioball with bil paloff press with RTB, 2x10 each direction; CGA for balance  Sitting on physioball, balancing with 1 LE, and ball toss, 3x10 tosses each; pt required heel touch assist of contralateral limb for balance; CGA for balance  Bil standing lat pulldowns (to facilitate more upright posture) x 10#,  2x10  Fwd/retro resisted walking with GTB, 1520ft x 3 laps each       PT Education - 12/08/15 1518    Education provided Yes   Education Details exercise technique, continue HEP   Person(s) Educated Patient   Methods Explanation   Comprehension Verbalized understanding             PT Long Term Goals - 10/13/15 1441      PT LONG TERM GOAL #1   Title Patient will complete 6748m walk in under 10 seconds to demonstrate appropriate speed for community mobility.    Baseline 12.93 seconds   Time 8   Period Weeks   Status New     PT LONG TERM GOAL #2   Title Patient will report worst VAS pain score of less than 4/10 to demonstrate improved tolerance for ADLs.    Baseline 10/10   Time 8   Period Weeks   Status New     PT LONG TERM GOAL #3   Title Patient will complete 5x sit to stand in less than 14 seconds to demonstrate appropriate LE strength and power to support torso.    Baseline 29.88 seconds w/o use of UEs.    Time 8   Period Weeks   Status New     PT LONG TERM GOAL #4   Title Patient will  report ODI score of less than 20% disability to demonstrate improved tolerance for ADLs.    Baseline 30%    Time 8   Period Weeks   Status New               Plan - 12/08/15 1519    Clinical Impression Statement Pt continues to make progress towards goals. Her endurance and strength is continuing to improve as demo by her decreased need for rest breaks. Pt BLE strength continues to be limited though and she needs continued skilled PT intervention to maximize overall function.   Rehab Potential Good   Clinical Impairments Affecting Rehab Potential Chronicity of fractures, osteoporosis, sedentary lifestyle.    PT Frequency 1x / week   PT Duration 8 weeks   PT Treatment/Interventions Aquatic Therapy;Biofeedback;Therapeutic exercise;Therapeutic activities;Taping;Manual techniques;Gait training   PT Next Visit Plan Peri-scapular musculature strengthening, gluteal/LE  strengthening, postural awareness (can try taping)   PT Home Exercise Plan Sit to stands w/o hands, standing hip abductions,   Consulted and Agree with Plan of Care Patient      Patient will benefit from skilled therapeutic intervention in order to improve the following deficits and impairments:  Abnormal gait, Improper body mechanics, Pain, Postural dysfunction, Difficulty walking, Decreased strength  Visit Diagnosis: Difficulty in walking, not elsewhere classified  Pain in thoracic spine     Problem List There are no active problems to display for this patient.  Katie Larsen, SPT  Katie Larsen 12/08/2015, 3:47 PM  [place] [facility] PHYSICAL AND SPORTS MEDICINE [address] [place], [address] Phone: [phone]   Fax:  [phone]  Name: Katie Larsen MRN: [id number] Date of Birth: [birth year]

## 2015-12-15 ENCOUNTER — Ambulatory Visit: Payer: Medicare Other | Attending: Geriatric Medicine

## 2015-12-15 DIAGNOSIS — R262 Difficulty in walking, not elsewhere classified: Secondary | ICD-10-CM

## 2015-12-15 DIAGNOSIS — M546 Pain in thoracic spine: Secondary | ICD-10-CM

## 2015-12-15 NOTE — Therapy (Addendum)
[facility]TS MEDIC[facility] [address] [place], [place], [address] Phone: [phone]   Fax:  [phone]  Physical Therapy Treatment/Progress Note  Patient Details  Name: Katie Larsen MRN: [id number] Date of Birth: 07/30/1955 No Data Recorded  Encounter Date: 12/15/2015      PT End of Session - 12/15/15 1345    Visit Number 8   Number of Visits 13   Date for PT Re-Evaluation 01/12/16   PT Start Time 1305   PT Stop Time 1400   PT Time Calculation (min) 55 min   Equipment Utilized During Treatment Oxygen   Activity Tolerance Patient tolerated treatment well   Behavior During Therapy WFL for tasks assessed/performed      Past Medical History:  Diagnosis Date  . COPD (chronic obstructive pulmonary disease) (HCC)   . Hypertension   . Renal disorder     Past Surgical History:  Procedure Laterality Date  . CHOLECYSTECTOMY      There were no vitals filed for this visit.      Subjective Assessment - 12/15/15 1307    Subjective Pt reports feeling okay. No specific questions or concerns at this time. Denies pain currently.    Pertinent History She is on chronic O2, on dialysis.    Limitations Standing;Walking;Lifting;Sitting   Patient Stated Goals To reduce her pain and become more functional.    Currently in Pain? No/denies   Pain Onset --     Therex: 5xSTS: 28 sec without UE support; indicates pt at increased risk for falls  Modified ODI: 26% (time to complete survey unbilled)  Updated goals with patient and discussed plan of care;  Standing hip abd on airex with RTB, 2x10 each; cues to decrease toe out  Retro resisted walking with grey theraband, 40ft x 2 laps; pt wanted to perform first lap without her oxygen and required seated rest break following; O2 was 90 but after another minute rest break, pt O2 was 93 which is baseline  Lat pull downs with 10#, 2x10; cues for proper technique   Bil rowing on machine,  10#, 2x10; cues for proper technique   Attempted leg press on TG but pt reported increased discomfort in back so d/c'd exercise  Leg press x 45#, 2x12  Paloff press with grey band while sitting on grey physioball (BLE on ground) x 10 each side; pt had increased difficulty with L; CGA for balance       PT Education - 12/15/15 1504    Education provided Yes   Education Details progress, POC, exercise technique   Person(s) Educated Patient   Methods Explanation   Comprehension Verbalized understanding             PT Long Term Goals - 12/15/15 1308      PT LONG TERM GOAL #1   Title Patient will complete 10m walk in under 10 seconds to demonstrate appropriate speed for community mobility.    Baseline (10/3) 11.18 sec   Time 8   Period Weeks   Status On-going     PT LONG TERM GOAL #2   Title Patient will report worst VAS pain score of less than 4/10 to demonstrate improved tolerance for ADLs.    Baseline (10/3)"varies from 2-10/10"   Time 8   Period Weeks   Status On-going     PT LONG TERM GOAL #3   Title Patient will complete 5x sit to stand in less than 14 seconds to demonstrate appropriate   LE strength and power to support torso.    Baseline (10/3) 28.0 sec without UE support    Time 8   Period Weeks   Status On-going     PT LONG TERM GOAL #4   Title Patient will report ODI score of less than 20% disability to demonstrate improved tolerance for ADLs.    Baseline (10/3) 26%   Time 8   Period Weeks   Status On-going               Plan - 12/15/15 1505    Clinical Impression Statement SPT reassessed pt's goals this date. Pt has not met any of her goals yet, but she has made some improvements since beginning therapy. She reports that she has improved 25% since starting therapy, stating that it is easier for her to get around in general. She states the other 75% is due to her pain which from the compression fractures. Pt endurance and activity tolerance continues  to be limited and varies based on energy levels. Pt continues to have deficits in core and BLE strength and needs continued skilled PT intervention to maximize overall function and strength.   Rehab Potential Good   Clinical Impairments Affecting Rehab Potential Chronicity of fractures, osteoporosis, sedentary lifestyle.    PT Frequency 1x / week   PT Duration 4 weeks   PT Treatment/Interventions Aquatic Therapy;Biofeedback;Therapeutic exercise;Therapeutic activities;Taping;Manual techniques;Gait training   PT Next Visit Plan Peri-scapular musculature strengthening, gluteal/LE strengthening, postural awareness (can try taping)   PT Home Exercise Plan Sit to stands w/o hands, standing hip abductions with RTB,    Consulted and Agree with Plan of Care Patient      Patient will benefit from skilled therapeutic intervention in order to improve the following deficits and impairments:  Abnormal gait, Improper body mechanics, Pain, Postural dysfunction, Difficulty walking, Decreased strength  Visit Diagnosis: Difficulty in walking, not elsewhere classified - Plan: PT plan of care cert/re-cert  Pain in thoracic spine - Plan: PT plan of care cert/re-cert       G-Codes - 12/16/15 1033    Functional Assessment Tool Used Modified ODI, 10m walk, 5x sit to stand    Functional Limitation Mobility: Walking and moving around   Mobility: Walking and Moving Around Current Status (G8978) At least 20 percent but less than 40 percent impaired, limited or restricted   Mobility: Walking and Moving Around Goal Status (G8979) At least 1 percent but less than 20 percent impaired, limited or restricted      Problem List There are no active problems to display for this patient.  This entire session was performed under direct supervision and direction of a licensed therapist/therapist assistant . I have personally read, edited and approve of the note as written.    , SPT Jason D Huprich PT, DPT    Huprich,Jason 12/16/2015, 10:37 AM  [facility] PHYSICAL AND SPORTS MEDICINE [address] [place], [place], [address] Phone: [phone]   Fax:  [phone]  Name: Katie Larsen MRN: [id number] Date of Birth: 05/16/1955    

## 2015-12-31 ENCOUNTER — Encounter: Payer: Medicare Other | Admitting: Physical Therapy

## 2016-01-05 ENCOUNTER — Ambulatory Visit: Payer: Medicare Other | Admitting: Physical Therapy

## 2016-01-05 DIAGNOSIS — R262 Difficulty in walking, not elsewhere classified: Secondary | ICD-10-CM

## 2016-01-05 DIAGNOSIS — M546 Pain in thoracic spine: Secondary | ICD-10-CM

## 2016-01-05 NOTE — Patient Instructions (Signed)
[email or web address]  Fwd and lateral step ups Weighted sit <> stand with eccentric stand > sit Resisted lateral walking with GTB

## 2016-01-05 NOTE — Therapy (Signed)
[facility] PHYS[facility] MEDICINE [address] [place], [place], [address] Phone: [phone]   Fax:  [phone]  Physical Therapy Treatment  Patient Details  Name: Katie LisLaura M Larsen MRN: [id number] Date of Birth: 1955/07/07 No Data Recorded  Encounter Date: 01/05/2016      PT End of Session - 01/05/16 1303    Visit Number 9   Number of Visits 13   Date for PT Re-Evaluation 01/12/16   PT Start Time 1303   PT Stop Time 1345   PT Time Calculation (min) 42 min   Equipment Utilized During Treatment Oxygen   Activity Tolerance Patient tolerated treatment well   Behavior During Therapy [facility] for tasks assessed/performed      Past Medical History:  Diagnosis Date  . COPD (chronic obstructive pulmonary disease) (HCC)   . Hypertension   . Renal disorder     Past Surgical History:  Procedure Laterality Date  . CHOLECYSTECTOMY      There were no vitals filed for this visit.      Subjective Assessment - 01/05/16 1307    Subjective Pt reports feeling good today. She states her back pain has been feeling about the same.   Pertinent History She is on chronic O2, on dialysis.    Limitations Standing;Walking;Lifting;Sitting   Patient Stated Goals To reduce her pain and become more functional.    Currently in Pain? Yes   Pain Score 3    Pain Location Back   Pain Orientation Mid   Pain Descriptors / Indicators Sore     THEREX Resisted retro walking with 2 GTB, 4630ft x 2 laps  Resisted lateral walking with GTB 5710ft x 3 laps  Bil fwd/lat step ups with 1 riser, x10 each direction; pt had light BUE support on TM during lateral step ups  Weighted sit <> stand with fast concentric and 5 sec eccentric stand > sit, 1x10 with 6# DB  Leg press 45# x 12, 55# x 10; pt reported 55# as challenging       PT Education - 01/05/16 1436    Education provided Yes   Education Details updated HEP.   Person(s) Educated Patient   Methods Explanation    Comprehension Verbalized understanding             PT Long Term Goals - 12/15/15 1308      PT LONG TERM GOAL #1   Title Patient will complete 6244m walk in under 10 seconds to demonstrate appropriate speed for community mobility.    Baseline (10/3) 11.18 sec   Time 8   Period Weeks   Status On-going     PT LONG TERM GOAL #2   Title Patient will report worst VAS pain score of less than 4/10 to demonstrate improved tolerance for ADLs.    Baseline (10/3)"varies from 2-10/10"   Time 8   Period Weeks   Status On-going     PT LONG TERM GOAL #3   Title Patient will complete 5x sit to stand in less than 14 seconds to demonstrate appropriate LE strength and power to support torso.    Baseline (10/3) 28.0 sec without UE support    Time 8   Period Weeks   Status On-going     PT LONG TERM GOAL #4   Title Patient will report ODI score of less than 20% disability to demonstrate improved tolerance for ADLs.    Baseline (10/3) 26%   Time 8   Period Weeks  Status On-going               Plan - 01/05/16 1436    Clinical Impression Statement Pt continuing to make progress towards goals as pt demonstrates improved tolerance for strengthening. Pt did present to therapy with increased fatigue which limited her somewhat in therapy today. Pt's O2 and BP WNL throughout and pt was on 2.5-3.0 L of O2. Pt given updated HEP to maximize strength and power of BLE. Pt needs continued skilled PT intervention to maximize overall function.   Rehab Potential Good   Clinical Impairments Affecting Rehab Potential Chronicity of fractures, osteoporosis, sedentary lifestyle.    PT Frequency 1x / week   PT Duration 4 weeks   PT Treatment/Interventions Aquatic Therapy;Biofeedback;Therapeutic exercise;Therapeutic activities;Taping;Manual techniques;Gait training   PT Next Visit Plan Peri-scapular musculature strengthening, gluteal/LE strengthening, postural awareness (can try taping)   PT Home Exercise  Plan Fwd and lateral step ups, Weighted sit <> stand with eccentric stand > sit, Resisted lateral walking with GTB   Consulted and Agree with Plan of Care Patient      Patient will benefit from skilled therapeutic intervention in order to improve the following deficits and impairments:  Abnormal gait, Improper body mechanics, Pain, Postural dysfunction, Difficulty walking, Decreased strength  Visit Diagnosis: Difficulty in walking, not elsewhere classified  Pain in thoracic spine     Problem List There are no active problems to display for this patient.  Jac Canavan, SPT  Jac Canavan 01/05/2016, 2:43 PM  [place] [facility] PHYSICAL AND SPORTS MEDICINE [address] [place], [address] Phone: [phone]   Fax:  [phone]  Name: Katie Larsen MRN: [id number] Date of Birth: 11-27-1955

## 2016-01-11 ENCOUNTER — Encounter: Payer: Medicare Other | Admitting: Physical Therapy

## 2016-01-12 ENCOUNTER — Encounter: Payer: Medicare Other | Admitting: Physical Therapy

## 2016-01-13 ENCOUNTER — Encounter: Payer: Medicare Other | Admitting: Physical Therapy

## 2016-01-14 ENCOUNTER — Ambulatory Visit: Payer: Medicare Other | Admitting: Physical Therapy

## 2016-01-18 ENCOUNTER — Encounter: Payer: Medicare Other | Admitting: Physical Therapy

## 2016-01-19 ENCOUNTER — Ambulatory Visit: Payer: Medicare Other | Attending: Geriatric Medicine | Admitting: Physical Therapy

## 2016-01-19 DIAGNOSIS — R262 Difficulty in walking, not elsewhere classified: Secondary | ICD-10-CM | POA: Diagnosis not present

## 2016-01-19 DIAGNOSIS — M546 Pain in thoracic spine: Secondary | ICD-10-CM | POA: Diagnosis present

## 2016-01-19 NOTE — Therapy (Cosign Needed)
[facility] [address] [place], [address] Phone: [phone]   Fax:  [phone]  Physical Therapy Treatment  Patient Details  Name: Katie Larsen MRN: [id number] Date of Birth: 1956/02/26 No Data Recorded  Encounter Date: 01/19/2016      PT End of Session - 01/19/16 1345    Visit Number 10   Number of Visits 13   Date for PT Re-Evaluation 01/12/16   PT Start Time 6168   PT Stop Time 1430   PT Time Calculation (min) 45 min   Equipment Utilized During Treatment Oxygen   Activity Tolerance Patient tolerated treatment well   Behavior During Therapy [facility] for tasks assessed/performed      Past Medical History:  Diagnosis Date  . COPD (chronic obstructive pulmonary disease) ([place])   . Hypertension   . Renal disorder     Past Surgical History:  Procedure Laterality Date  . CHOLECYSTECTOMY      There were no vitals filed for this visit.      Subjective Assessment - 01/19/16 1346    Subjective Pt reports feeling better from last week when she had a stomach bug. She states that her breathing has been up and down recently.   Pertinent History She is on chronic O2, on dialysis.    Limitations Standing;Walking;Lifting;Sitting   Patient Stated Goals To reduce her pain and become more functional.    Currently in Pain? Yes   Pain Score 4    Pain Location Back   Pain Orientation Right;Left;Mid   Pain Descriptors / Indicators Discomfort;Aching     THEREX: 10MWT: 12.43 sec, about the same as last progress note 5xSTS: 18.3 sec, improvement from last progress note 6 min walk test: 632f with 2 standing rest breaks  Leg press on OMEGA, 55# x15, 65# x8, x10 (more challenging to pt); pt needed slight assistance getting weight moving initially  EDUCATION: POC; HEP; extensive education of benefits of CV endurance rehab/exercises and how we can address those deficits; also educated pt on importance of her  being diligent with HEP for CV improvements       PT Education - 01/19/16 1410    Education provided Yes   Education Details pt verbalized ultimate goal of getting off oxygen, so PT provided extensive education on affecting aerobic capacity and CV endurance and how she is going to have to be dedicated to HEP outside of PT sessions.   Person(s) Educated Patient   Methods Explanation   Comprehension Verbalized understanding             PT Long Term Goals - 01/19/16 1350      PT LONG TERM GOAL #1   Title Patient will complete 127malk in under 10 seconds to demonstrate appropriate speed for community mobility.    Baseline 11/7: 12.43 sec   Time 8   Period Weeks   Status On-going     PT LONG TERM GOAL #2   Title Patient will report worst VAS pain score of less than 4/10 to demonstrate improved tolerance for ADLs.    Baseline 11/7: "it can stll get to a 10; it was a 10/10 3-4 days ago."   Time 8   Period Weeks   Status On-going     PT LONG TERM GOAL #3   Title Patient will complete 5x sit to stand in less than 14 seconds to demonstrate appropriate LE strength and power to support torso.  Baseline 11/7: 18.3 sec   Time 8   Period Weeks   Status On-going     PT LONG TERM GOAL #4   Title Patient will report ODI score of less than 20% disability to demonstrate improved tolerance for ADLs.    Baseline (10/3) 26%   Time 8   Period Weeks   Status On-going               Plan - 01/19/16 1424    Clinical Impression Statement SPT reassessed pt's goals this date and pt has not yet met any of her goals, though she has made some improvements since starting therapy. She has made the most gains in her BLE strength and power as demo by improved 5xSTS time. . She also feels like her BLE strength has improved the most since starting therapy, but she was unable to put a numerical percentage on her progress. She states that her main limiting factors are her pain and CV  endurance/dependency on oxygen. PT provided extensive education on benefits of CV training and stated that she will need to be diligent during HEP to maximize overall CV effects. PT educated pt that he will required a referral from her physician in order to work on those deficits. Pt needs continued skilled PT intervention to maximize overall function.   Rehab Potential Good   Clinical Impairments Affecting Rehab Potential Chronicity of fractures, osteoporosis, sedentary lifestyle.    PT Frequency 1x / week   PT Duration 6 weeks   PT Treatment/Interventions Aquatic Therapy;Biofeedback;Therapeutic exercise;Therapeutic activities;Taping;Manual techniques;Gait training   PT Next Visit Plan Peri-scapular musculature strengthening, gluteal/LE strengthening, postural awareness (can try taping)   PT Home Exercise Plan Fwd and lateral step ups, Weighted sit <> stand with eccentric stand > sit, Resisted lateral walking with GTB   Consulted and Agree with Plan of Care Patient      Patient will benefit from skilled therapeutic intervention in order to improve the following deficits and impairments:  Abnormal gait, Improper body mechanics, Pain, Postural dysfunction, Difficulty walking, Decreased strength  Visit Diagnosis: Difficulty in walking, not elsewhere classified  Pain in thoracic spine     Problem List There are no active problems to display for this patient.  Geraldine Solar, SPT  Geraldine Solar 01/19/2016, 2:31 PM  [facility] [address] [place], [address] Phone: [phone]   Fax:  [phone]  Name: Katie Larsen MRN: [id number] Date of Birth: 1955-08-07

## 2016-01-20 ENCOUNTER — Encounter: Payer: Medicare Other | Admitting: Physical Therapy

## 2016-01-25 ENCOUNTER — Encounter: Payer: Medicare Other | Admitting: Physical Therapy

## 2016-01-26 ENCOUNTER — Ambulatory Visit: Payer: Medicare Other | Admitting: Physical Therapy

## 2016-01-26 ENCOUNTER — Encounter: Payer: Medicare Other | Admitting: Physical Therapy

## 2016-01-26 DIAGNOSIS — R262 Difficulty in walking, not elsewhere classified: Secondary | ICD-10-CM | POA: Diagnosis not present

## 2016-01-26 DIAGNOSIS — M546 Pain in thoracic spine: Secondary | ICD-10-CM

## 2016-01-26 NOTE — Therapy (Signed)
[facility] [address] [place], [place], [address] Phone: [phone]   Fax:  [phone]  Physical Therapy Treatment  Patient Details  Name: Katie Larsen MRN: [id number] Date of Birth: 01/05/1956 No Data Recorded  Encounter Date: 01/26/2016      PT End of Session - 01/26/16 1346    Visit Number 11   Number of Visits 13   Date for PT Re-Evaluation 03/01/16   PT Start Time 1345   PT Stop Time 1416   PT Time Calculation (min) 31 min   Equipment Utilized During Treatment Oxygen   Activity Tolerance Patient tolerated treatment well   Behavior During Therapy [facility] for tasks assessed/performed      Past Medical History:  Diagnosis Date  . COPD (chronic obstructive pulmonary disease) ([facility])   . Hypertension   . Renal disorder     Past Surgical History:  Procedure Laterality Date  . CHOLECYSTECTOMY      There were no vitals filed for this visit.      Subjective Assessment - 01/26/16 1346    Subjective Pt reports feeling okay this date. She states the cold air is making it hard for her breathing. But other than that, she states she feels pretty good.   Pertinent History She is on chronic O2, on dialysis.    Limitations Standing;Walking;Lifting;Sitting   Patient Stated Goals To reduce her pain and become more functional.    Currently in Pain? No/denies      THEREX: Leg press 55# x15, 65# x15, 75# x5; pt reported 75# "too much" and wished to stop after 5 reps  [facility]; pt reported weight as "just right" with regards to difficulty level  [facility]#, 2[facility]; pt reported weight as "just right" with regards to difficulty level  Sitting on physioball with alternating staggered foot placement and with ball toss, 2x15 each; SBA for steadiness, pt noted with improved stability compared to previous attempts       PT Education - 01/26/16 1417    Education provided  Yes   Education Details continue HEP, exercise technique   Person(s) Educated Patient   Methods Explanation   Comprehension Verbalized understanding             PT Long Term Goals - 01/19/16 1350      PT LONG TERM GOAL #1   Title Patient will complete 6274m walk in under 10 seconds to demonstrate appropriate speed for community mobility.    Baseline 11/7: 12.43 sec   Time 8   Period Weeks   Status On-going     PT LONG TERM GOAL #2   Title Patient will report worst VAS pain score of less than 4/10 to demonstrate improved tolerance for ADLs.    Baseline 11/7: "it can stll get to a 10; it was a 10/10 3-4 days ago."   Time 8   Period Weeks   Status On-going     PT LONG TERM GOAL #3   Title Patient will complete 5x sit to stand in less than 14 seconds to demonstrate appropriate LE strength and power to support torso.    Baseline 11/7: 18.3 sec   Time 8   Period Weeks   Status On-going     PT LONG TERM GOAL #4   Title Patient will report ODI score of less than 20% disability to demonstrate improved tolerance for ADLs.    Baseline (  10/3) 26%   Time 8   Period Weeks   Status On-going               Plan - 01/26/16 1651    Clinical Impression Statement Pt continues to make progress towards goals. She was educated that the PT will reach out to her doctor regarding a referral for CV endurance training between now and her next session so that those deficits can be addressed in future therapy sessions. Pt verbalized understanding. She tolerated postural and BLE strengthening well this date and needs continued skilled PT intervention to maximize overall function.   Rehab Potential Good   Clinical Impairments Affecting Rehab Potential Chronicity of fractures, osteoporosis, sedentary lifestyle.    PT Frequency 1x / week   PT Duration 6 weeks   PT Treatment/Interventions Aquatic Therapy;Biofeedback;Therapeutic exercise;Therapeutic activities;Taping;Manual techniques;Gait  training   PT Next Visit Plan Peri-scapular musculature strengthening, gluteal/LE strengthening, postural awareness (can try taping)   PT Home Exercise Plan Fwd and lateral step ups, Weighted sit <> stand with eccentric stand > sit, Resisted lateral walking with GTB   Consulted and Agree with Plan of Care Patient      Patient will benefit from skilled therapeutic intervention in order to improve the following deficits and impairments:  Abnormal gait, Improper body mechanics, Pain, Postural dysfunction, Difficulty walking, Decreased strength  Visit Diagnosis: Difficulty in walking, not elsewhere classified  Pain in thoracic spine     Problem List There are no active problems to display for this patient.  [name], SPT  [name] 01/26/2016, 4:53 PM   This entire session was performed under direct supervision and direction of a licensed therapist/therapist assistant . I have personally read, edited and approve of the note as written. Encarnacion ChuAshley Abashian PT, DPT  [facility] [address] [place], [place], [address] Phone: [phone]   Fax:  [phone]  Name: Katie Larsen MRN: [id number] Date of Birth: 1956-01-18

## 2016-01-27 ENCOUNTER — Encounter: Payer: Medicare Other | Admitting: Physical Therapy

## 2016-02-01 ENCOUNTER — Encounter: Payer: Medicare Other | Admitting: Physical Therapy

## 2016-02-01 ENCOUNTER — Ambulatory Visit: Payer: Medicare Other | Admitting: Physical Therapy

## 2016-02-01 DIAGNOSIS — R262 Difficulty in walking, not elsewhere classified: Secondary | ICD-10-CM | POA: Diagnosis not present

## 2016-02-01 DIAGNOSIS — M546 Pain in thoracic spine: Secondary | ICD-10-CM

## 2016-02-01 NOTE — Patient Instructions (Addendum)
94% HR72 [facility] FValoSelect Specialty Hospital JohnstoBranThe Center For Specialized Surgery At TerliChilton GReErmalCharlsie MerlTenny Cr29562Jackson GeneraBeckley Surgery Center InclLaredo Digestive Health Center44 52LEzequ2OptDorothey [id number] Mackool Eye INorthClayborDewayne Ha28Allison QuarrShela NevTresa [phone] River EValoSurgicare Of Miramar LImbMidmichigan Medical CErmalCharlsie MerlTenny Cr29562Coral Gables SurgVa Medical Center - Vancouver CampuseLawrence & Memorial Hosp59i29tEzeClayborDewayne Ha78Allison QuarrShela NevTresa En51dHonorhealth Deer Valley MedicaValoTyler Continue Care HospitThomasviRussell County MeErmalCharlsie MerlTenny Cr29562Lallie Kemp Regional MediDiginity Health-St.Rose Dominican Blue Daimond CampuscSaint Thomas Hospital For Specialty Sur42ge445Ezequ23OptDorothey B417ClayborDewayne Ha93Allison QuarrShela NevTresa [phone]<MEASUREMENT25>84Ni31cCampbellton-Graceville ValoAdvanced Vision Surgery Center LCoffee SpriRefugio County Memorial HospitaWaihee-WaChiltErmalCharlsie MerlTenny Cr29562Foundations BehavioLawrence County Memorial HospitalrPorter-Portage Hospital Campu65s-68Ezequ37OptDorothey [id number] Ha87Allison QuarrShela NevTresa Endo([phone])Endoscopy Center ValoDuke Regional HospitCentRiverside Hospital Of LouisParChilton GEncompass HErmalCharlsie MerlTenny Cr29562GreenlPam Rehabilitation Hospital Of TulsaeEndoscopy Center Of Toms R61iv249Ezequ57OptDorothey B5344098ClayborDew26aPointe Coupee General ValorEl CCharlsie MerlTenny CrawOhio State UnivDewayne [phone]<Endoscopy Center At RobinValoDelta Endoscopy Center St. Augustine SoKessler Institute For Rehabilitation Incorporated - NortCampChilton GMary FreeErmalCharlsie MerlTenny Cr29562Specialty SurgiCarondelet St Josephs HospitalcTexoma Outpatient Surgery Center70 I47Ezequ53OptDorothey B55240981OClaybor40DLabettValorBaylor Scott & White EmergenCharlsie MerlTenny CrawCommunityAmery Hospital And Clin2759Opti39cGreenwood Dewayne [phone]<Candescent Eye Health SurgiceValoAmarillo Cataract And Eye SurgeStanleytWest Bloomfield Surgery Center LLC Dba Lakes SurgSouth LancaChilton GAdventist ErmalCharlsie MerlTenny Cr29562Pasadena Advanced SurgerySouthwest Endoscopy Ltd Outpatient Surgery Center81 I7Ezequ69OptDorothey B5674098ClayborDewayne Ha56Allison QuarrShela NevTresa [phone]<MEASUREMENT72>657NEndoscopy Center Of NiaValoDocs Surgical HospitAbiqBetter Living EndoscDelaChErmalCharlsie MerlTenny Cr29562George E Weems MemoriaMedinasummit Ambulatory Surgery CenterlWilliamson Surgery Ce10n49tEzequ38OptDorothey B56440981Albuquerque Ambulatory EyeClayborDewayne Ha77Allison QuarrShela NevTresa Endo61933Nicholos497546 Samue2245lProcedure Center Of South SacramValoriMckay-Dee HospitaConservator, museZachary Costco Wh6oRuxton SurgiceValoSutter Center For PsychiatPine IslConnecticut Orthopaedic SurgSeacChiltonErmalCharlsie MerlTenny Cr29562Parkwood Behavioral HeaLufkin Endoscopy Center LtdlSurgery Center Of Independenc61e 616Ezequ62OptDorothey B41040981Michigan EndoscopVibra Hospital Of WClayborDewayne Ha40Allison QuarrShela NevTresa [phone]<MEA30SSouth Ms State ValoDoctors Center Hospital- Bayamon (Ant. Matildes BreneBarrington HiSurgical AColuChErmalCharlsie MerlTenny Cr29562Superior Endoscopy CeNorth Tampa Behavioral HealthnRiver View Surgery Ce14nt373Ezequ28OptDorothey B24640981Bridgepoint NatAdvanced Care Hospital ClayborDewayne Ha70Allison QuarrShela NevTresa [phone]<MEA40SCollege Heights Endoscopy CeValoOptima Specialty HospitAlDecatur Urology SurgMount Chilton GDigeErmalCharlsie MerlTenny Cr29562St George SurgicalStockton Outpatient Surgery Center LLC Dba Ambulatory Surgery Center Of [place]ton Eye Surgery Center Of Georgia43 L70Ezequ86OptDorothey B34140981Eye SurgClayborDewayne Ha70Allison QuarrShela NevTresa Endo254804988292RinksekMiddCampbellton-Graceville H59oNorthlake Behavioral [id number] tched at 3 minutes in) -- returned to 91% before second bout of walking.  TM intervals at 1.5 mph for 1:30 rest at 0.5 mph for 3 minutes of recovery , 1.0 mph for 1 minute, 30" of recovery at 0.4 mph. Within 1 minute of ceasing ambulation 90%, 82 bpm. Got up to 98%.   Leg Press 65# x 3 (challenging as her legs were tired) switched to 55# x 12 -- 65# x 10 repetitions x 12 repetitions   TM at 1.0 mph for 2.5 minutes -- 2.5L 82%, with 1 minute recovery - 90%

## 2016-02-01 NOTE — Therapy (Signed)
[facility] [address] [place], [place], [address] Phone: [phone]   Fax:  [phone]  Physical Therapy Treatment  Patient Details  Name: Katie Larsen MRN: [id number] Date of Birth: 25-Dec-1955 No Data Recorded  Encounter Date: 02/01/2016      PT End of Session - 02/01/16 1427    Visit Number 12   Number of Visits 21   Date for PT Re-Evaluation 03/14/16   PT Start Time 1345   PT Stop Time 1428   PT Time Calculation (min) 43 min   Equipment Utilized During Treatment Oxygen   Activity Tolerance Patient tolerated treatment well;Patient limited by fatigue   Behavior During Therapy [facility] LlLPWFL for tasks assessed/performed      Past Medical History:  Diagnosis Date  . COPD (chronic obstructive pulmonary disease) ([facility])   . Hypertension   . Renal disorder     Past Surgical History:  Procedure Laterality Date  . CHOLECYSTECTOMY      There were no vitals filed for this visit.      Subjective Assessment - 02/01/16 1347    Subjective Patient reports her fingers are rather cold today, she appears to have more pallor on this date. She had her dialysis yesterday, back has been about the same.    Pertinent History She is on chronic O2, on dialysis.    Limitations Standing;Walking;Lifting;Sitting   Patient Stated Goals To reduce her pain and become more functional.    Currently in Pain? No/denies      94% HR -81 bpm   6MW test - 430'   At 3 minute mark 83% on 3L, 81 bpm -- feeling short of breath. (initially on 2L, switched at 3 minutes in) -- returned to 91% before second bout of walking.  TM intervals at 1.5 mph for 1:30 rest at 0.5 mph for 3 minutes of recovery , 1.0 mph for 1 minute, 30" of recovery at 0.4 mph. Within 1 minute of ceasing ambulation 90%, 82 bpm. Got up to 98%.   Leg Press 65# x 3 (challenging as her legs were tired) switched to 55# x 12 -- 65# x 10 repetitions x 12 repetitions   TM at 1.0 mph  for 2.5 minutes -- 2.5L 82%, with 1 minute recovery - 90%                            PT Education - 02/01/16 1427    Education provided Yes   Education Details Perform ambulation at home until she feels a little short of breath, rest and continue for 10-15 minutes to increase aerobic tolerance.    Person(s) Educated Patient   Methods Explanation   Comprehension Verbalized understanding             PT Long Term Goals - 02/01/16 1534      PT LONG TERM GOAL #1   Title Patient will complete 1947m walk in under 10 seconds to demonstrate appropriate speed for community mobility.    Baseline 11/7: 12.43 sec   Time 8   Period Weeks   Status On-going     PT LONG TERM GOAL #2   Title Patient will report worst VAS pain score of less than 4/10 to demonstrate improved tolerance for ADLs.    Baseline 11/7: "it can stll get to a 10; it was a 10/10 3-4 days ago."   Time 8   Period Weeks  Status On-going     PT LONG TERM GOAL #3   Title Patient will complete 5x sit to stand in less than 14 seconds to demonstrate appropriate LE strength and power to support torso.    Baseline 11/7: 18.3 sec   Time 8   Period Weeks   Status On-going     PT LONG TERM GOAL #4   Title Patient will report ODI score of less than 20% disability to demonstrate improved tolerance for ADLs.    Baseline (10/3) 26%   Time 8   Period Weeks   Status On-going     PT LONG TERM GOAL #5   Title Patient will anmbulate at least 750' in 6 minute walk test to demonstrate improved tolerance for community ADLs.    Baseline 430' on 11/20   Time 6   Period Weeks   Status New               Plan - 02/01/16 1428    Clinical Impression Statement Patient demonstrates significant reduction in ambulatory tolerance secondary to cardiopulmonary deficits. She responds well to interval training in this session, combined with resistance training (to improve overall muscle mass and capillary density of  working tissues). Educated patient to continue this process at home for optimal results.    Rehab Potential Good   Clinical Impairments Affecting Rehab Potential Chronicity of fractures, osteoporosis, sedentary lifestyle.    PT Frequency 1x / week   PT Duration 6 weeks   PT Treatment/Interventions Aquatic Therapy;Biofeedback;Therapeutic exercise;Therapeutic activities;Taping;Manual techniques;Gait training   PT Next Visit Plan Peri-scapular musculature strengthening, gluteal/LE strengthening, postural awareness (can try taping)   PT Home Exercise Plan Fwd and lateral step ups, Weighted sit <> stand with eccentric stand > sit, Resisted lateral walking with GTB   Consulted and Agree with Plan of Care Patient      Patient will benefit from skilled therapeutic intervention in order to improve the following deficits and impairments:  Abnormal gait, Improper body mechanics, Pain, Postural dysfunction, Difficulty walking, Decreased strength  Visit Diagnosis: Difficulty in walking, not elsewhere classified - Plan: PT plan of care cert/re-cert  Pain in thoracic spine - Plan: PT plan of care cert/re-cert     Problem List There are no active problems to display for this patient.  Kerin RansomPatrick A McNamara, PT, DPT    02/01/2016, 3:40 PM  [facility] [address] [place], [place], [address] Phone: [phone]   Fax:  [phone]  Name: Katie Larsen MRN: [id number] Date of Birth: Oct 31, 1955

## 2016-02-02 ENCOUNTER — Encounter: Payer: Medicare Other | Admitting: Physical Therapy

## 2016-02-03 ENCOUNTER — Encounter: Payer: Medicare Other | Admitting: Physical Therapy

## 2016-02-09 ENCOUNTER — Ambulatory Visit: Payer: Medicare Other | Admitting: Physical Therapy

## 2016-02-11 ENCOUNTER — Encounter: Payer: Medicare Other | Admitting: Physical Therapy

## 2016-02-16 ENCOUNTER — Encounter: Payer: Medicare Other | Admitting: Physical Therapy

## 2016-02-16 ENCOUNTER — Ambulatory Visit: Payer: Medicare Other | Attending: Geriatric Medicine | Admitting: Physical Therapy

## 2016-02-16 DIAGNOSIS — R5381 Other malaise: Secondary | ICD-10-CM

## 2016-02-16 DIAGNOSIS — M546 Pain in thoracic spine: Secondary | ICD-10-CM | POA: Diagnosis present

## 2016-02-16 DIAGNOSIS — R262 Difficulty in walking, not elsewhere classified: Secondary | ICD-10-CM

## 2016-02-16 NOTE — Therapy (Signed)
[place] [facility] [address] [place], [place], [address] Phone: [phone]   Fax:  [phone]  Physical Therapy Treatment  Patient Details  Name: Katie LisLaura M Larsen MRN: [id number] Date of Birth: 03/16/1955 No Data Recorded  Encounter Date: 02/16/2016      PT End of Session - 02/16/16 1346    Visit Number 13   Number of Visits 21   Date for PT Re-Evaluation 03/14/16   PT Start Time 1300   PT Stop Time 1335   PT Time Calculation (min) 35 min   Equipment Utilized During Treatment Oxygen   Activity Tolerance Patient tolerated treatment well;Patient limited by fatigue   Behavior During Therapy [employer] for tasks assessed/performed      Past Medical History:  Diagnosis Date  . COPD (chronic obstructive pulmonary disease) (HCC)   . Hypertension   . Renal disorder     Past Surgical History:  Procedure Laterality Date  . CHOLECYSTECTOMY      There were no vitals filed for this visit.      Subjective Assessment - 02/16/16 1303    Subjective Patient reports she had a [place] on her [place] on [place] L[place] Reports that the operation went well. She is having L mid back pain, she reports it just started today and that this is the same pain she has had in the past.    Pertinent History She is on chronic O2, on dialysis.    Limitations Standing;Walking;Lifting;Sitting   Patient Stated Goals To reduce her pain and become more functional.    Currently in Pain? Yes   Pain Location Back   Pain Orientation Mid;Left   Pain Descriptors / Indicators Sharp   Pain Type Chronic pain   Pain Onset More than a month ago   Pain Frequency Intermittent      TM at 1.5mph for 2:40 minute warm up (asked to stop) -- O2 went down to 82%, increased to 2L and monitored O2 HR-90 bpm -- returned to 90% on 2L after 3 minute break   At rest O2 - 93%, 67 bpm on 1.5L of O2.   TM once recovered O2 at 1.2 mph -  (started at 5:30) for 2 minutes (O2 decreased to 80% on 2L) discontinued, monitored -- returned to 90% on 2L after 2.5 minutes rest at 2L of O2 - 1.2 mph for 3.5 minutes (O2 to 85% by the end).   Total of 0.19 miles   Soft tissue mobilization over tender area in mid-thoracic spine area on L side  Ice applied to mid thoracic area for pain control (unbilled)                             PT Education - 02/16/16 1344    Education provided Yes   Education Details Educated on [employer] law, using weight training to stimulate bone density, higher intensity bouts of exercise to work    [employer] HotelsPerson(s) Educated Patient   Methods Explanation   Comprehension Verbalized understanding             PT Long Term Goals - 02/01/16 1534      PT LONG TERM GOAL #1   Title Patient will complete 5640m walk in under 10 seconds to demonstrate appropriate speed for community mobility.    Baseline 11/7: 12.43 sec   Time 8   Period Weeks   Status On-going  PT LONG TERM GOAL #2   Title Patient will report worst VAS pain score of less than 4/10 to demonstrate improved tolerance for ADLs.    Baseline 11/7: "it can stll get to a 10; it was a 10/10 3-4 days ago."   Time 8   Period Weeks   Status On-going     PT LONG TERM GOAL #3   Title Patient will complete 5x sit to stand in less than 14 seconds to demonstrate appropriate LE strength and power to support torso.    Baseline 11/7: 18.3 sec   Time 8   Period Weeks   Status On-going     PT LONG TERM GOAL #4   Title Patient will report ODI score of less than 20% disability to demonstrate improved tolerance for ADLs.    Baseline (10/3) 26%   Time 8   Period Weeks   Status On-going     PT LONG TERM GOAL #5   Title Patient will anmbulate at least 750' in 6 minute walk test to demonstrate improved tolerance for community ADLs.    Baseline 430' on 11/20   Time 6   Period Weeks   Status New               Plan - 02/16/16 1347     Clinical Impression Statement Patient presents with symptomatic mid-thoracic pain with radiation to her L side. She is able to participate in high intensity TM training (for her), though desaturates on O2 quite quickly, even with supplemental O2 indicating a significant mobility impairment. She was also curious as to how exercise could play a role in osteoporosis and compression fractures, educated her on [facility] CenterWolff's law, progressive loading, upright posture.    Rehab Potential Good   Clinical Impairments Affecting Rehab Potential Chronicity of fractures, osteoporosis, sedentary lifestyle.    PT Frequency 1x / week   PT Duration 6 weeks   PT Treatment/Interventions Aquatic Therapy;Biofeedback;Therapeutic exercise;Therapeutic activities;Taping;Manual techniques;Gait training   PT Next Visit Plan Peri-scapular musculature strengthening, gluteal/LE strengthening, postural awareness (can try taping)   PT Home Exercise Plan Fwd and lateral step ups, Weighted sit <> stand with eccentric stand > sit, Resisted lateral walking with GTB   Consulted and Agree with Plan of Care Patient      Patient will benefit from skilled therapeutic intervention in order to improve the following deficits and impairments:  Abnormal gait, Improper body mechanics, Pain, Postural dysfunction, Difficulty walking, Decreased strength  Visit Diagnosis: Difficulty in walking, not elsewhere classified  Pain in thoracic spine  Physical deconditioning     Problem List There are no active problems to display for this patient.  Kerin RansomPatrick A Jakel Alphin, PT, DPT    02/16/2016, 6:15 PM  [facility] [address] [place], [place], [address] Phone: [phone]   Fax:  [phone]  Name: Katie Larsen MRN: [id number] Date of Birth: [birth year]

## 2016-02-16 NOTE — Patient Instructions (Addendum)
TM at 1. for 2:40 minute warm up (asked to stop) -- O2 went down to 82%, increased to 2L and monitored O2 HR-90 bpm -- returned to 90% on 2L after 3 minute break   At rest O2 - 93%, 67 bpm on 1.5L of O2.   TM once recovered O2 at 1.2 mph - (started at 5:30) for 2 minutes (O2 decreased to 80% on 2L) discontinued, monitored -- returned to 90% on 2L after 2.5 minutes rest at 2L of O2 - 1.2 mph for 3.5 minutes (O2 to 85% by the end).   Total of 0.19 miles   Soft tissue mobilization over tender area in mid-thoracic spine area on L side  Ice applied to mid thoracic area for pain control (unbilled)

## 2016-02-18 ENCOUNTER — Encounter: Payer: Medicare Other | Admitting: Physical Therapy

## 2016-02-23 ENCOUNTER — Encounter: Payer: Medicare Other | Admitting: Physical Therapy

## 2016-02-23 ENCOUNTER — Ambulatory Visit: Payer: Medicare Other | Admitting: Physical Therapy

## 2016-02-23 DIAGNOSIS — R5381 Other malaise: Secondary | ICD-10-CM

## 2016-02-23 DIAGNOSIS — M546 Pain in thoracic spine: Secondary | ICD-10-CM

## 2016-02-23 DIAGNOSIS — R262 Difficulty in walking, not elsewhere classified: Secondary | ICD-10-CM | POA: Diagnosis not present

## 2016-02-23 NOTE — Therapy (Signed)
[facility] [facility] [address] [place], [place], [address] Phone: [phone]   Fax:  [phone]  Physical Therapy Treatment  Patient Details  Name: Katie LisLaura M Willits MRN: [id number] Date of Birth: 08-Jun-1955 No Data Recorded  Encounter Date: 02/23/2016      PT End of Session - 02/23/16 1422    Visit Number 14   Number of Visits 21   Date for PT Re-Evaluation 03/14/16   PT Start Time 1300   PT Stop Time 1355   PT Time Calculation (min) 55 min   Equipment Utilized During Treatment Oxygen   Activity Tolerance Patient tolerated treatment well   Behavior During Therapy [facility]WFL for tasks assessed/performed      Past Medical History:  Diagnosis Date  . COPD (chronic obstructive pulmonary disease) (HCC)   . Hypertension   . Renal disorder     Past Surgical History:  Procedure Laterality Date  . CHOLECYSTECTOMY      There were no vitals filed for this visit.      Subjective Assessment - 02/23/16 1306    Subjective Patient reports no issues with fistula. She has not had any more sharp pain in her mid back, more just the dull ache. She has been very diligent with her walking program.    Pertinent History She is on chronic O2, on dialysis.    Limitations Standing;Walking;Lifting;Sitting   Patient Stated Goals To reduce her pain and become more functional.    Currently in Pain? Yes  "Usual back pain"   Pain Onset More than a month ago      TM 1.0 mph x 5 minutes, alternated on 1.5 minute increments from 1.3 mph to 0.8 mph until 15 minutes, last session 2 minutes between 1.5 mph and 1.7 mph O2 alternated between 85-90% on 2L. HR between 80-92 bpm -- no shortness of breath even at higher gait velocities.    Standing rows with lat pulldown bar x 12 repetitions at 5#, 10# x 10 repetitions, x 10# for 10 repetitions   Standing hip abductions x 10 per side with yellow t-band (O2 at 92%) for 2 sets   Standing hip extensions x  10 per side with red t-band    Sit to stand for speed with concentric and slow eccentric x 10 with TRX on 1st set, 2nd set x 12 repetitions, 3rd set -- x10                           PT Education - 02/23/16 1351    Education provided Yes   Education Details Continue walking program, as her O2 levels and energy level are clearly improved from last visit.    Person(s) Educated Patient   Methods Explanation;Demonstration   Comprehension Verbalized understanding;Returned demonstration             PT Long Term Goals - 02/01/16 1534      PT LONG TERM GOAL #1   Title Patient will complete 5765m walk in under 10 seconds to demonstrate appropriate speed for community mobility.    Baseline 11/7: 12.43 sec   Time 8   Period Weeks   Status On-going     PT LONG TERM GOAL #2   Title Patient will report worst VAS pain score of less than 4/10 to demonstrate improved tolerance for ADLs.    Baseline 11/7: "it can stll get to a 10; it was a 10/10 3-4 days  ago."   Time 8   Period Weeks   Status On-going     PT LONG TERM GOAL #3   Title Patient will complete 5x sit to stand in less than 14 seconds to demonstrate appropriate LE strength and power to support torso.    Baseline 11/7: 18.3 sec   Time 8   Period Weeks   Status On-going     PT LONG TERM GOAL #4   Title Patient will report ODI score of less than 20% disability to demonstrate improved tolerance for ADLs.    Baseline (10/3) 26%   Time 8   Period Weeks   Status On-going     PT LONG TERM GOAL #5   Title Patient will anmbulate at least 750' in 6 minute walk test to demonstrate improved tolerance for community ADLs.    Baseline 430' on 11/20   Time 6   Period Weeks   Status New               Plan - 02/23/16 1422    Clinical Impression Statement Patient demonstrates improved stamina, O2 sats throughout ambulation at increased speed on this date, increasing her total distance by roughly 50%. She is  able to perform standing rows, hip abductions/extensions and TRX sit to stands to promote weightbearing resistance activities. She maintained O2 levels > 88% for the majority of this session on 2L, no complaints of shortness of breath.    Rehab Potential Good   Clinical Impairments Affecting Rehab Potential Chronicity of fractures, osteoporosis, sedentary lifestyle.    PT Frequency 1x / week   PT Duration 6 weeks   PT Treatment/Interventions Aquatic Therapy;Biofeedback;Therapeutic exercise;Therapeutic activities;Taping;Manual techniques;Gait training   PT Next Visit Plan Peri-scapular musculature strengthening, gluteal/LE strengthening, postural awareness (can try taping)   PT Home Exercise Plan Fwd and lateral step ups, Weighted sit <> stand with eccentric stand > sit, Resisted lateral walking with GTB   Consulted and Agree with Plan of Care Patient      Patient will benefit from skilled therapeutic intervention in order to improve the following deficits and impairments:  Abnormal gait, Improper body mechanics, Pain, Postural dysfunction, Difficulty walking, Decreased strength  Visit Diagnosis: Difficulty in walking, not elsewhere classified  Pain in thoracic spine  Physical deconditioning     Problem List There are no active problems to display for this patient.  Kerin RansomPatrick A Simcha Speir, PT, DPT    02/23/2016, 3:54 PM  [facility] [address] [place], [place], [address] Phone: [phone]   Fax:  [phone]  Name: Katie LisLaura M Larsen MRN: [id number] Date of Birth: May 19, 1955

## 2016-02-23 NOTE — Patient Instructions (Addendum)
TM 1.0 mph x 5 minutes, alternated on 1.5 minute increments from 1.3 mph to 0.8 mph until 15 minutes, last session 2 minutes between 1.5 mph and 1.7 mph O2 alternated between 85-90% on 2L. HR between 80-92 bpm   Standing rows with lat pulldown bar x 12 repetitions at 5#, 10# x 10 repetitions, x 10# for 10 repetitions   Standing hip abductions x 10 per side with yellow t-band (O2 at 92%) for 2 sets   Standing hip extensions x 10 per side with red t-band    Sit to stand for speed with concentric and slow eccentric x 10 with TRX on 1st set, 2nd set x 12 repetitions, 3rd set -- x10

## 2016-02-25 ENCOUNTER — Encounter: Payer: Medicare Other | Admitting: Physical Therapy

## 2016-03-01 ENCOUNTER — Ambulatory Visit: Payer: Medicare Other | Admitting: Physical Therapy

## 2016-03-01 ENCOUNTER — Encounter: Payer: Medicare Other | Admitting: Physical Therapy

## 2016-03-01 DIAGNOSIS — M546 Pain in thoracic spine: Secondary | ICD-10-CM

## 2016-03-01 DIAGNOSIS — R262 Difficulty in walking, not elsewhere classified: Secondary | ICD-10-CM

## 2016-03-01 DIAGNOSIS — R5381 Other malaise: Secondary | ICD-10-CM

## 2016-03-01 NOTE — Patient Instructions (Addendum)
TM at 1.0 x 2.5 minutes O2 sats at 88% on 2L, bumped up to 1.5 mph for 2 minutes (90% on 2L), bumped up to 1.8 mph for 1 minute - 89%. 1.0 ,mph for 1 minute, 1.9 mph for 1 minute- 89% 80 bpm   TRX Lunges x 12 with RLE in front, x 5 before discomfort on RLE in front (uncomfortable as well). - discontinued.   Step Ups with two risers and 2 HHA x 12 per side (O2 down to 86% on 1.5L of O2 after completion). On 2nd set -- with 1 finger balance assist (more challenging on leg musculature) x 12 per side   Standing hip abductions on MATRIX - 1st set x 10 2nd set x 6 at 25#  (91% afterwards)

## 2016-03-02 NOTE — Therapy (Signed)
[facility] [address] [place], [place], [address] Phone: [phone]   Fax:  [phone]  Physical Therapy Treatment  Patient Details  Name: Katie Larsen MRN: [id number] Date of Birth: February 17, 1956 No Data Recorded  Encounter Date: 03/01/2016      PT End of Session - 03/01/16 1328    Visit Number 15   Number of Visits 21   Date for PT Re-Evaluation 03/14/16   PT Start Time 1310   PT Stop Time 1349   PT Time Calculation (min) 39 min   Equipment Utilized During Treatment Oxygen   Activity Tolerance Patient tolerated treatment well   Behavior During Therapy [facility] for tasks assessed/performed      Past Medical History:  Diagnosis Date  . COPD (chronic obstructive pulmonary disease) (HCC)   . Hypertension   . Renal disorder     Past Surgical History:  Procedure Laterality Date  . CHOLECYSTECTOMY      There were no vitals filed for this visit.      Subjective Assessment - 03/01/16 1315    Subjective Patient reports she has had roughly same level of discomfort in her back, no sharp pain. She has continued to perform walking program at home.    Pertinent History She is on chronic O2, on dialysis.    Limitations Standing;Walking;Lifting;Sitting   Patient Stated Goals To reduce her pain and become more functional.    Currently in Pain? --  "reports her back is ok"   Pain Location Back   Pain Orientation Mid   Pain Descriptors / Indicators Aching   Pain Type Chronic pain   Pain Onset More than a month ago   Pain Frequency Intermittent      TM at 1.0 x 2.5 minutes O2 sats at 88% on 2L, bumped up to 1.5 mph for 2 minutes (90% on 2L), bumped up to 1.8 mph for 1 minute - 89%. 1.0 ,mph for 1 minute, 1.9 mph for 1 minute- 89% 80 bpm   TRX Lunges x 12 with RLE in front, x 5 before discomfort on RLE in front (uncomfortable as well). - discontinued.   Step Ups with two risers and 2 HHA x 12 per side (O2 down  to 86% on 1.5L of O2 after completion). On 2nd set -- with 1 finger balance assist (more challenging on leg musculature) x 12 per side   Standing hip abductions on MATRIX - 1st set x 10 2nd set x 6 at 25#  (91% afterwards)                            PT Education - 03/01/16 1324    Education provided Yes   Education Details Will continue strengthening program in clinic, maintain walking program at home as she is tolerating higher speeds on TM in clinic.    Person(s) Educated Patient   Methods Explanation;Demonstration   Comprehension Verbalized understanding;Returned demonstration             PT Long Term Goals - 02/01/16 1534      PT LONG TERM GOAL #1   Title Patient will complete 7826m walk in under 10 seconds to demonstrate appropriate speed for community mobility.    Baseline 11/7: 12.43 sec   Time 8   Period Weeks   Status On-going     PT LONG TERM GOAL #2   Title Patient will report worst VAS pain  score of less than 4/10 to demonstrate improved tolerance for ADLs.    Baseline 11/7: "it can stll get to a 10; it was a 10/10 3-4 days ago."   Time 8   Period Weeks   Status On-going     PT LONG TERM GOAL #3   Title Patient will complete 5x sit to stand in less than 14 seconds to demonstrate appropriate LE strength and power to support torso.    Baseline 11/7: 18.3 sec   Time 8   Period Weeks   Status On-going     PT LONG TERM GOAL #4   Title Patient will report ODI score of less than 20% disability to demonstrate improved tolerance for ADLs.    Baseline (10/3) 26%   Time 8   Period Weeks   Status On-going     PT LONG TERM GOAL #5   Title Patient will anmbulate at least 750' in 6 minute walk test to demonstrate improved tolerance for community ADLs.    Baseline 430' on 11/20   Time 6   Period Weeks   Status New               Plan - 03/01/16 1329    Clinical Impression Statement Patient again shows improvement in O2 sats throughout  session, tolerating more activity in sessions with less rest breaks and no instances where her O2 sats dropped below 87-88% on this date. Her LE strength appears to be improving as well as she is tolerating increased difficulty/progressions of exercises. Patient has been able to be more active at home with less physical limitations per her self report.    Rehab Potential Good   Clinical Impairments Affecting Rehab Potential Chronicity of fractures, osteoporosis, sedentary lifestyle.    PT Frequency 1x / week   PT Duration 6 weeks   PT Treatment/Interventions Aquatic Therapy;Biofeedback;Therapeutic exercise;Therapeutic activities;Taping;Manual techniques;Gait training   PT Next Visit Plan Peri-scapular musculature strengthening, gluteal/LE strengthening, postural awareness (can try taping)   PT Home Exercise Plan Fwd and lateral step ups, Weighted sit <> stand with eccentric stand > sit, Resisted lateral walking with GTB   Consulted and Agree with Plan of Care Patient      Patient will benefit from skilled therapeutic intervention in order to improve the following deficits and impairments:  Abnormal gait, Improper body mechanics, Pain, Postural dysfunction, Difficulty walking, Decreased strength  Visit Diagnosis: Difficulty in walking, not elsewhere classified  Pain in thoracic spine  Physical deconditioning       G-Codes - 03/02/16 0827    Functional Assessment Tool Used Self Report   Functional Limitation Mobility: Walking and moving around   Mobility: Walking and Moving Around Current Status ([id number]) At least 1 percent but less than 20 percent impaired, limited or restricted   Mobility: Walking and Moving Around Goal Status ([id number]) At least 1 percent but less than 20 percent impaired, limited or restricted      Problem List There are no active problems to display for this patient.  Kerin RansomPatrick A McNamara, PT, DPT    03/02/2016, 8:28 AM  [facility] [address] [place], [place], [address] Phone: [phone]   Fax:  [phone]  Name: Katie Larsen MRN: [id number] Date of Birth: Jul 03, 1955

## 2016-03-03 ENCOUNTER — Encounter: Payer: Medicare Other | Admitting: Physical Therapy

## 2016-03-08 ENCOUNTER — Encounter: Payer: Medicare Other | Admitting: Physical Therapy

## 2016-03-08 ENCOUNTER — Ambulatory Visit: Payer: Medicare Other | Admitting: Physical Therapy

## 2016-03-08 DIAGNOSIS — R5381 Other malaise: Secondary | ICD-10-CM

## 2016-03-08 DIAGNOSIS — R262 Difficulty in walking, not elsewhere classified: Secondary | ICD-10-CM

## 2016-03-08 DIAGNOSIS — M546 Pain in thoracic spine: Secondary | ICD-10-CM

## 2016-03-08 NOTE — Patient Instructions (Addendum)
6 MW test on 1.5 L of O2 -- 700' with O2 tank. 90% O2 sat after ambulation, though she reports feeling fatigued (not from muscles, from cardiopulmonary perspective).   5x sit stand - 12.7 seconds initially, 15.1 seconds on second attempt (no use of UEs).   Extension in standing, rotations do not appear to provoke symptoms.   Standing with knees flexed - able to touch toes without pain, with knees extended reported pain and able to get to mid shin level.    Seated trunk flexion x 8 with grey theraball.  (reported feeling better with standing trunk flexion after completing) Noticed slight increase in ROM as well   Observed lifting mechanics, noted to have rotation in spine to complete as she held weight in one hand only. Cued to hold with both hands, otherwise good mechanics. Performed x 8 with 10# DB (O2 down to 81%, recovered to 90% within 1 minute on 2L of O2).   Standing rows x 15 at 10#, x 12 x#15

## 2016-03-08 NOTE — Therapy (Signed)
[facility] [address], [place], [address] Phone: [phone]   Fax:  [phone]  Physical Therapy Treatment  Patient Details  Name: Katie Larsen MRN: [id number] Date of Birth: [birth year] No Data Recorded  Encounter Date: 03/08/2016      PT End of Session - 03/08/16 1311    Visit Number 15   Number of Visits 21   Date for PT Re-Evaluation 04/19/16   PT Start Time 1300   PT Stop Time 1348   PT Time Calculation (min) 48 min   Equipment Utilized During Treatment Oxygen   Activity Tolerance Patient tolerated treatment well   Behavior During Therapy [facility] for tasks assessed/performed      Past Medical History:  Diagnosis Date  . COPD (chronic obstructive pulmonary disease) ([place])   . Hypertension   . Renal disorder     Past Surgical History:  Procedure Laterality Date  . CHOLECYSTECTOMY      There were no vitals filed for this visit.      Subjective Assessment - 03/08/16 1302    Subjective Patient reports she had been doing relatively well until just a few hours ago she began to get the shooting pain in her mid back. She reports it lasts 2-3", but is very inconsistent. She has not had a lot of sleep, she has had 4 treatments in the last 7 days.    Pertinent History She is on chronic O2, on dialysis.    Limitations Standing;Walking;Lifting;Sitting   Patient Stated Goals To reduce her pain and become more functional.    Currently in Pain? Yes   Pain Location Back   Pain Orientation Mid   Pain Descriptors / Indicators Shooting   Pain Type Chronic pain   Pain Onset More than a month ago      6 MW test on 1.5 L of O2 -- 700' with O2 tank. 90% O2 sat after ambulation, though she reports feeling fatigued (not from muscles, from cardiopulmonary perspective).   5x sit stand - 12.7 seconds initially, 15.1 seconds on second attempt (no use of UEs).   Extension in standing, rotations do not appear to  provoke symptoms.   Standing with knees flexed - able to touch toes without pain, with knees extended reported pain and able to get to mid shin level.    Seated trunk flexion x 8 with grey theraball.  (reported feeling better with standing trunk flexion after completing) Noticed slight increase in ROM as well   Observed lifting mechanics, noted to have rotation in spine to complete as she held weight in one hand only. Cued to hold with both hands, otherwise good mechanics. Performed x 8 with 10# DB (O2 down to 81%, recovered to 90% within 1 minute on 2L of O2).   Standing rows x 15 at 10#, x 12 x#15                            PT Education - 03/08/16 1338    Education provided Yes   Education Details Progress with therapy, continue walking at high intensity intervals. Importance of posture to reduce risk of compression fx and related pain.    Person(s) Educated Patient   Methods Explanation;Demonstration   Comprehension Returned demonstration;Verbalized understanding             PT Long Term Goals - 03/08/16 1619      PT LONG TERM  GOAL #1   Title Patient will complete 26mwalk in under 10 seconds to demonstrate appropriate speed for community mobility.    Baseline 11/7: 12.43 sec.    Time 8   Period Weeks   Status On-going     PT LONG TERM GOAL #2   Title Patient will report worst VAS pain score of less than 4/10 to demonstrate improved tolerance for ADLs.    Baseline 11/7: "it can stll get to a 10; it was a 10/10 3-4 days ago."..Marland Kitchen12/26 - having sharp pain which she did not rate.    Time 8   Period Weeks   Status On-going     PT LONG TERM GOAL #3   Title Patient will complete 5x sit to stand in less than 14 seconds to demonstrate appropriate LE strength and power to support torso.    Baseline 11/7: 18.3 sec. On 12/26 - 12.71 seconds.    Time 8   Period Weeks   Status Achieved     PT LONG TERM GOAL #4   Title Patient will report ODI score of less  than 20% disability to demonstrate improved tolerance for ADLs.    Baseline (10/3) 26%   Time 8   Period Weeks   Status On-going     PT LONG TERM GOAL #5   Title Patient will anmbulate at least 750' in 6 minute walk test to demonstrate improved tolerance for community ADLs.    Baseline 430' on 11/20. on 12/26 - 700'    Time 6   Period Weeks   Status Partially Met               Plan - 03/08/16 1310    Clinical Impression Statement Patient shows significant improvement in 6MW test (430' to 700') at lower O2 rate and good O2 sats after completion (1.5L and 90%). She also demonstrates improved 5x sit to stand, but is noted to fatigue quickly with both. She also demonstrates deficits in posterior scapular musculature strength, likely contributing to sharp pains in thoracic area. She is progressing well with focus on endurance and postural strengthening.   Rehab Potential Good   Clinical Impairments Affecting Rehab Potential Chronicity of fractures, osteoporosis, sedentary lifestyle.    PT Frequency 1x / week   PT Duration 6 weeks   PT Treatment/Interventions Aquatic Therapy;Biofeedback;Therapeutic exercise;Therapeutic activities;Taping;Manual techniques;Gait training   PT Next Visit Plan Peri-scapular musculature strengthening, gluteal/LE strengthening, postural awareness (can try taping)   PT Home Exercise Plan Fwd and lateral step ups, Weighted sit <> stand with eccentric stand > sit, Resisted lateral walking with GTB   Consulted and Agree with Plan of Care Patient      Patient will benefit from skilled therapeutic intervention in order to improve the following deficits and impairments:  Abnormal gait, Improper body mechanics, Pain, Postural dysfunction, Difficulty walking, Decreased strength  Visit Diagnosis: Pain in thoracic spine  Difficulty in walking, not elsewhere classified  Physical deconditioning     Problem List There are no active problems to display for this  patient.  PKerman Passey PT, DPT    03/08/2016, 4:22 PM  [facility] [address] [place] [address]Phone: [phone]  Fax:  [phone] Name: Katie DORWARTMRN: [id number] of Birth: 4February 11, 1957

## 2016-03-09 ENCOUNTER — Emergency Department
Admission: EM | Admit: 2016-03-09 | Discharge: 2016-03-09 | Disposition: A | Discharge status: Home / Self Care | Payer: Medicare Other | Attending: Emergency Medicine | Admitting: Emergency Medicine

## 2016-03-09 DIAGNOSIS — J449 Chronic obstructive pulmonary disease, unspecified: Secondary | ICD-10-CM | POA: Insufficient documentation

## 2016-03-09 DIAGNOSIS — Z87891 Personal history of nicotine dependence: Secondary | ICD-10-CM | POA: Insufficient documentation

## 2016-03-09 DIAGNOSIS — Y828 Other medical devices associated with adverse incidents: Secondary | ICD-10-CM | POA: Insufficient documentation

## 2016-03-09 DIAGNOSIS — I10 Essential (primary) hypertension: Secondary | ICD-10-CM | POA: Diagnosis not present

## 2016-03-09 DIAGNOSIS — T82590A Other mechanical complication of surgically created arteriovenous fistula, initial encounter: Secondary | ICD-10-CM

## 2016-03-09 DIAGNOSIS — Z79899 Other long term (current) drug therapy: Secondary | ICD-10-CM | POA: Insufficient documentation

## 2016-03-09 DIAGNOSIS — T8249XA Other complication of vascular dialysis catheter, initial encounter: Secondary | ICD-10-CM | POA: Diagnosis not present

## 2016-03-09 MED ORDER — ONDANSETRON 4 MG PO TBDP
ORAL_TABLET | ORAL | Status: AC
Start: 2016-03-09 — End: 2016-03-09
  Administered 2016-03-09: 4 mg via ORAL
  Filled 2016-03-09: qty 1

## 2016-03-09 MED ORDER — ONDANSETRON 4 MG PO TBDP
4.0000 mg | ORAL_TABLET | Freq: Once | ORAL | Status: AC
  Administered 2016-03-09: 4 mg via ORAL

## 2016-03-09 NOTE — ED Provider Notes (Signed)
[facility] Emergency Department Provider Note   ____________________________________________    I have reviewed the triage vital signs and the nursing notes.   HISTORY  Chief Complaint Vascular Access Problem   HPI Katie Larsen is a 60 y.o. female who presents with bleeding from left sided AV graft status post dialysis. Patient reports they were unable to stop the bleeding although she did complete dialysis today. Bleeding is under control this time. She has no complaints. She reports her [facility] She has no other complaints at this time.   Past Medical History:  Diagnosis Date  . COPD (chronic obstructive pulmonary disease) (HCC)   . Hypertension   . Renal disorder     There are no active problems to display for this patient.   Past Surgical History:  Procedure Laterality Date  . AV FISTULA PLACEMENT    . CHOLECYSTECTOMY      Prior to Admission medications   Medication Sig Start Date End Date Taking? Authorizing Provider  carvedilol (COREG) 25 MG tablet Take 25 mg by mouth 2 (two) times daily with a meal.    Historical Provider, MD  hydrALAZINE (APRESOLINE) 100 MG tablet Take 100 mg by mouth 2 (two) times daily.    Historical Provider, MD  lisinopril (PRINIVIL,ZESTRIL) 20 MG tablet Take 20 mg by mouth daily.    Historical Provider, MD  traMADol (ULTRAM) 50 MG tablet Take 50 mg by mouth every morning.    Historical Provider, MD     Allergies Hydrocodone  No family history on file.  Social History Social History  Substance Use Topics  . Smoking status: Former Games developermoker  . Smokeless tobacco: Never Used  . Alcohol use No    Review of Systems  Constitutional: No fever/chills  Cardiovascular: Denies chest pain. Respiratory: Denies shortness of breath. Gastrointestinal: No abdominal pain.  Genitourinary: Negative for dysuria. Musculoskeletal: Negative for back pain. Skin: Bleeding as  above, controlled Neurological: Negative for headaches  10-point ROS otherwise negative.  ____________________________________________   PHYSICAL EXAM:  VITAL SIGNS: ED Triage Vitals  Enc Vitals Group     BP 03/09/16 1157 (!) 170/94     Pulse Rate 03/09/16 1157 79     Resp 03/09/16 1157 (!) 21     Temp 03/09/16 1157 98.2 F (36.8 C)     Temp Source 03/09/16 1157 Oral     SpO2 03/09/16 1157 99 %     Weight 03/09/16 1158 130 lb (59 kg)     Height 03/09/16 1158 5\' 4"  (1.626 m)     Head Circumference --      Peak Flow --      Pain Score --      Pain Loc --      Pain Edu? --      Excl. in GC? --     Constitutional: Alert and oriented. No acute distress. Pleasant and interactive Eyes: Conjunctivae are normal. No pallor  Mouth/Throat: Mucous membranes are moist.    Cardiovascular: Normal rate, regular rhythm. Grossly normal heart sounds.  Good peripheral circulation. Respiratory: Normal respiratory effort.  No retractions.  Gastrointestinal: Soft and nontender. No distention.   Genitourinary: deferred Musculoskeletal:   Warm and well perfused upper extremities. Dried blood along left AV fistula, positive thrill. No active bleeding Neurologic:  Normal speech and language. No gross focal neurologic deficits are appreciated.  Skin:  Skin is warm, dry and intact. No rash noted.  Psychiatric: Mood and affect are normal. Speech and behavior are normal.  ____________________________________________   LABS (all labs ordered are listed, but only abnormal results are displayed)  Labs Reviewed - No data to display ____________________________________________  EKG  None ____________________________________________  RADIOLOGY  None ____________________________________________   PROCEDURES  Procedure(s) performed: No    Critical Care performed: No ____________________________________________   INITIAL IMPRESSION / ASSESSMENT AND PLAN / ED COURSE  Pertinent labs &  imaging results that were available during my care of the patient were reviewed by me and considered in my medical decision making (see chart for details).  Gauze with overlying Tegaderm applied to AV fistula, we will observe in the ED and discuss with her nephrologist/neurosurgeon  Clinical Course   ----------------------------------------- 1:20 PM on 03/09/2016 -----------------------------------------  [facility] ____________________________________________ [facility], they will arrange for fistulogram. No further bleeding, patient content with plan  FINAL CLINICAL IMPRESSION(S) / ED DIAGNOSES  Final diagnoses:  AV graft malfunction, initial encounter (HCC)      NEW MEDICATIONS STARTED DURING THIS VISIT:  New Prescriptions   No medications on file     Note:  This document was prepared using Dragon voice recognition software and may include unintentional dictation errors.    Jene Everyobert Lauris Keepers, MD 03/09/16 [phone]

## 2016-03-09 NOTE — Discharge Instructions (Signed)
Please contact Dr. Bing NeighborsSwaim's office to schedule an evaluation of your AV fistula

## 2016-03-09 NOTE — ED Notes (Signed)
Pt called out for nausea, this nurse assessed pt who was dry heaving. Pt states she usually has something on her stomach by now and is nauseous. MD notified.

## 2016-03-09 NOTE — ED Triage Notes (Signed)
Per EMS, pt received dialysis and her AV fistula above her left arm would not stop bleeding, EMS reports it was bleeding for an hour before they transferred her. Pt states she had "work done on the fistula 3 weeks ago." Pt is also on chronic 2L of oxygen. BP in transport with EMS was 225/105, HR 82. Pt A&O and in NAD at this time.

## 2016-03-10 ENCOUNTER — Encounter: Payer: Medicare Other | Admitting: Physical Therapy

## 2016-03-15 ENCOUNTER — Encounter: Payer: Medicare Other | Admitting: Physical Therapy

## 2016-03-17 ENCOUNTER — Ambulatory Visit: Payer: Medicare Other | Attending: Geriatric Medicine | Admitting: Physical Therapy

## 2016-03-17 ENCOUNTER — Encounter: Payer: Medicare Other | Admitting: Physical Therapy

## 2016-03-17 DIAGNOSIS — M546 Pain in thoracic spine: Secondary | ICD-10-CM | POA: Insufficient documentation

## 2016-03-17 DIAGNOSIS — R5381 Other malaise: Secondary | ICD-10-CM | POA: Insufficient documentation

## 2016-03-17 DIAGNOSIS — R262 Difficulty in walking, not elsewhere classified: Secondary | ICD-10-CM | POA: Insufficient documentation

## 2016-03-22 ENCOUNTER — Ambulatory Visit: Payer: Medicare Other | Admitting: Physical Therapy

## 2016-03-22 ENCOUNTER — Encounter: Payer: Medicare Other | Admitting: Physical Therapy

## 2016-03-22 DIAGNOSIS — M546 Pain in thoracic spine: Secondary | ICD-10-CM | POA: Diagnosis not present

## 2016-03-22 DIAGNOSIS — R262 Difficulty in walking, not elsewhere classified: Secondary | ICD-10-CM | POA: Diagnosis present

## 2016-03-22 DIAGNOSIS — R5381 Other malaise: Secondary | ICD-10-CM | POA: Diagnosis present

## 2016-03-22 NOTE — Patient Instructions (Addendum)
TM at 1.0 mph on 1.5L of O2 for 7.5 minutes (0.12 miles before she became "winded")  Rested 2 minutes -- returned to 1.5 mph for 2 minutes (reduced to 0.7 mph for 1.5 minutes)  2 minutes at 1.5 mph for a total of 0.25 miles (fatigue noted after and required seated rest break)  Step ups with 2 risers x 15 (fatigued afterwards) (added 5# x 8 on LLE, upped to 8# x 5, switched to 10# for RLE completed 5 before fatigue) -- 2nd set with 10# bilaterally x9 repetitions (quite challenging)

## 2016-03-22 NOTE — Therapy (Signed)
[facility] [address], [place], [address] Phone: [phone]   Fax:  [phone]  Physical Therapy Treatment  Patient Details  Name: Katie Larsen MRN: [id number] Date of Birth: [birth year] No Data Recorded  Encounter Date: 03/22/2016      PT End of Session - 03/22/16 1154    Visit Number 16   Number of Visits 21   Date for PT Re-Evaluation 04/19/16   PT Start Time 1115   PT Stop Time 1200   PT Time Calculation (min) 45 min   Equipment Utilized During Treatment Oxygen   Activity Tolerance Patient tolerated treatment well   Behavior During Therapy [facility] for tasks assessed/performed      Past Medical History:  Diagnosis Date  . COPD (chronic obstructive pulmonary disease) ([place])   . Hypertension   . Renal disorder     Past Surgical History:  Procedure Laterality Date  . AV FISTULA PLACEMENT    . CHOLECYSTECTOMY      There were no vitals filed for this visit.      Subjective Assessment - 03/22/16 1126    Subjective Patient reports she had one of the sharp aching pain in her back on Saturday. She is going to need to have an operation on her catheter site again. She reports she had a good appointment yesterday with blood sugar levels looking good.    Pertinent History She is on chronic O2, on dialysis.    Limitations Standing;Walking;Lifting;Sitting   Patient Stated Goals To reduce her pain and become more functional.    Currently in Pain? No/denies        TM at 1.0 mph on 1.5L of O2 for 7.5 minutes (0.12 miles before she became "winded")  Rested 2 minutes -- returned to 1.5 mph for 2 minutes (reduced to 0.7 mph for 1.5 minutes)  2 minutes at 1.5 mph for a total of 0.25 miles (fatigue noted after and required seated rest break)  Step ups with 2 risers x 15 (fatigued afterwards) (added 5# x 8 on LLE, upped to 8# x 5, switched to 10# for RLE completed 5 before fatigue) -- 2nd set with 10#  bilaterally x9 repetitions (quite challenging)                          PT Education - 03/22/16 1154    Education provided Yes   Education Details Given her improved tolerance to exercise will begin to wean down therapy and transition to home based program. Continue with interval walking.    Person(s) Educated Patient   Methods Explanation;Demonstration   Comprehension Verbalized understanding;Returned demonstration             PT Long Term Goals - 03/08/16 1619      PT LONG TERM GOAL #1   Title Patient will complete 85mwalk in under 10 seconds to demonstrate appropriate speed for community mobility.    Baseline 11/7: 12.43 sec.    Time 8   Period Weeks   Status On-going     PT LONG TERM GOAL #2   Title Patient will report worst VAS pain score of less than 4/10 to demonstrate improved tolerance for ADLs.    Baseline 11/7: "it can stll get to a 10; it was a 10/10 3-4 days ago."..Marland Kitchen12/26 - having sharp pain which she did not rate.    Time 8   Period Weeks   Status  On-going     PT LONG TERM GOAL #3   Title Patient will complete 5x sit to stand in less than 14 seconds to demonstrate appropriate LE strength and power to support torso.    Baseline 11/7: 18.3 sec. On 12/26 - 12.71 seconds.    Time 8   Period Weeks   Status Achieved     PT LONG TERM GOAL #4   Title Patient will report ODI score of less than 20% disability to demonstrate improved tolerance for ADLs.    Baseline (10/3) 26%   Time 8   Period Weeks   Status On-going     PT LONG TERM GOAL #5   Title Patient will anmbulate at least 750' in 6 minute walk test to demonstrate improved tolerance for community ADLs.    Baseline 430' on 11/20. on 12/26 - 700'    Time 6   Period Weeks   Status Partially Met               Plan - 03/22/16 1154    Clinical Impression Statement Patient is able to ambulate increased distance in decreased time on TM and tolerate increased speeds for 2 minutes  at a time on 1.5L of O2. She continues to be quite limited (winded after 0.25 miles) with endurance, but is requiring fewer and shorter rest breaks throughout this session. Unable to monitor O2 directly due to poor circulation in her hands, relied on patient feedback only in this session.    Rehab Potential Good   Clinical Impairments Affecting Rehab Potential Chronicity of fractures, osteoporosis, sedentary lifestyle.    PT Frequency 1x / week   PT Duration 6 weeks   PT Treatment/Interventions Aquatic Therapy;Biofeedback;Therapeutic exercise;Therapeutic activities;Taping;Manual techniques;Gait training   PT Next Visit Plan Peri-scapular musculature strengthening, gluteal/LE strengthening, postural awareness (can try taping)   PT Home Exercise Plan Fwd and lateral step ups, Weighted sit <> stand with eccentric stand > sit, Resisted lateral walking with GTB   Consulted and Agree with Plan of Care Patient      Patient will benefit from skilled therapeutic intervention in order to improve the following deficits and impairments:  Abnormal gait, Improper body mechanics, Pain, Postural dysfunction, Difficulty walking, Decreased strength  Visit Diagnosis: Pain in thoracic spine  Difficulty in walking, not elsewhere classified  Physical deconditioning     Problem List There are no active problems to display for this patient.   Royce Macadamia 03/22/2016, 12:43 PM  [place] [facility] [address], [place], [address] Phone: [phone]   Fax:  [phone]  Name: Katie Larsen MRN: [id number] Date of Birth: June 10, 1955

## 2016-03-24 ENCOUNTER — Encounter: Payer: Medicare Other | Admitting: Physical Therapy

## 2016-03-29 ENCOUNTER — Ambulatory Visit: Payer: Medicare Other | Admitting: Physical Therapy

## 2016-03-29 ENCOUNTER — Encounter: Payer: Medicare Other | Admitting: Physical Therapy

## 2016-03-31 ENCOUNTER — Encounter: Payer: Medicare Other | Admitting: Physical Therapy

## 2016-04-05 ENCOUNTER — Ambulatory Visit: Payer: Medicare Other | Admitting: Physical Therapy

## 2016-04-05 DIAGNOSIS — M546 Pain in thoracic spine: Secondary | ICD-10-CM

## 2016-04-05 DIAGNOSIS — R262 Difficulty in walking, not elsewhere classified: Secondary | ICD-10-CM

## 2016-04-05 DIAGNOSIS — R5381 Other malaise: Secondary | ICD-10-CM

## 2016-04-05 NOTE — Therapy (Signed)
[place] [address] [place], [address] Phone: [phone]   Fax:  [phone]  Physical Therapy Treatment  Patient Details  Name: Katie Larsen MRN: [id number] Date of Birth: 06-09-1955 No Data Recorded  Encounter Date: 04/05/2016      PT End of Session - 04/05/16 1412    Visit Number 17   Number of Visits 21   Date for PT Re-Evaluation 05/31/16   PT Start Time 1345   PT Stop Time 1430   PT Time Calculation (min) 45 min   Equipment Utilized During Treatment Oxygen   Activity Tolerance Patient tolerated treatment well;Patient limited by fatigue   Behavior During Therapy [facility] for tasks assessed/performed      Past Medical History:  Diagnosis Date  . COPD (chronic obstructive pulmonary disease) ([place])   . Hypertension   . Renal disorder     Past Surgical History:  Procedure Laterality Date  . AV FISTULA PLACEMENT    . CHOLECYSTECTOMY      There were no vitals filed for this visit.      Subjective Assessment - 04/05/16 1659    Subjective Patient reports she had another sx on her fistula, putting in another angiogram which (along with the weather) has decreased her ability to exercise. She has had discomfort around her fistula and asked not to exercise it today.    Pertinent History She is on chronic O2, on dialysis.    Limitations Standing;Walking;Lifting;Sitting   Patient Stated Goals To reduce her pain and become more functional.    Currently in Pain? No/denies       6MW test- 700' with O2 on 1.5 L (rested final 30") -- 92% on 1.5L, 94% on 1.L after 1 minute break after gait session.   78mWalk - 10.25 seconds   Modified ODI - 10%   Discussed HEP with patient to include walking program (provided progressions of 1:1 ratios of work rest for 12-15 minutes for the first 2 weeks, up to 20 minutes on weeks 3-4 3x per week as well as sit to stands, step ups, and band resisted hip abductions  for non- walking program dates. Informed patient she would benefit from completing this to an intensity where she feels moderately dyspnic.                         PT Education - 04/05/16 1412    Education provided Yes   Education Details Will have patient perform more of her conditioning at home or at a gym around town.    Person(s) Educated Patient   Methods Demonstration;Explanation   Comprehension Verbalized understanding;Returned demonstration             PT Long Term Goals - 04/05/16 1347      PT LONG TERM GOAL #1   Title Patient will complete 155malk in under 10 seconds to demonstrate appropriate speed for community mobility.    Baseline 11/7: 12.43  -- on 1/23/ -- 10.25 seconds    Time 8   Period Weeks   Status Partially Met     PT LONG TERM GOAL #2   Title Patient will report worst VAS pain score of less than 4/10 to demonstrate improved tolerance for ADLs.    Baseline 11/7: "it can stll get to a 10; it was a 10/10 3-4 days ago.".. Marland Kitchen2/26 - having sharp pain which she did not rate. -- "it hasn't  been hurting too bad.. reports a 6 instead of 8-9 in the past"   Time 8   Period Weeks   Status Partially Met     PT LONG TERM GOAL #3   Title Patient will complete 5x sit to stand in less than 14 seconds to demonstrate appropriate LE strength and power to support torso.    Baseline 11/7: 18.3 sec. On 12/26 - 12.71 seconds.    Time 8   Period Weeks   Status Achieved     PT LONG TERM GOAL #4   Title Patient will report ODI score of less than 20% disability to demonstrate improved tolerance for ADLs.    Baseline (10/3) 26% -- 1/23/-10%   Time 8   Period Weeks   Status Achieved     PT LONG TERM GOAL #5   Title Patient will anmbulate at least 750' in 6 minute walk test to demonstrate improved tolerance for community ADLs.    Baseline 430' on 11/20. on 12/26 - 700'  -- 700' with 30" left but finished due to fatigue on May 02, 2016   Time 6   Period Weeks    Status Partially Met               Plan - 05-02-2016 1411    Clinical Impression Statement Patient reports less pain throughout the day, she has not had instances of sharp pain in her thoracic spine recently. She is now really most limited by her respiratory status and deconditioning. She requires multiple breaks throughout 6MW test, though her O2 saturation levels are appropriate. She is unable to go to [facility] or [facility] due to dialysis, she was informed on walking program and going to a gym/exercise facility to improve her cardiovascular fitness.   Rehab Potential Good   Clinical Impairments Affecting Rehab Potential Chronicity of fractures, osteoporosis, sedentary lifestyle.    PT Frequency 1x / week   PT Duration 6 weeks   PT Treatment/Interventions Aquatic Therapy;Biofeedback;Therapeutic exercise;Therapeutic activities;Taping;Manual techniques;Gait training   PT Next Visit Plan Peri-scapular musculature strengthening, gluteal/LE strengthening, postural awareness (can try taping)   PT Home Exercise Plan Fwd and lateral step ups, Weighted sit <> stand with eccentric stand > sit, Resisted lateral walking with GTB   Consulted and Agree with Plan of Care Patient      Patient will benefit from skilled therapeutic intervention in order to improve the following deficits and impairments:  Abnormal gait, Improper body mechanics, Pain, Postural dysfunction, Difficulty walking, Decreased strength  Visit Diagnosis: Pain in thoracic spine - Plan: PT plan of care cert/re-cert  Difficulty in walking, not elsewhere classified - Plan: PT plan of care cert/re-cert  Physical deconditioning - Plan: PT plan of care cert/re-cert       G-Codes - 05-02-2016 1706    Functional Assessment Tool Used 6MW test, 40mwalk   Functional Limitation Mobility: Walking and moving around   Mobility: Walking and Moving Around Current Status ((H6073 At least 1 percent but less than 20 percent impaired,  limited or restricted   Mobility: Walking and Moving Around Goal Status (([id number] At least 1 percent but less than 20 percent impaired, limited or restricted      Problem List There are no active problems to display for this patient.  PKerman Passey PT, DPT    102/19/2018 5:09 PM  [facility] [address] [place] [address]Phone: [phone]  Fax:  [phone] Name:  Katie Larsen MRN: [id number] Date of Birth: 03/19/1955

## 2016-04-07 ENCOUNTER — Encounter: Payer: Medicare Other | Admitting: Physical Therapy

## 2016-04-12 ENCOUNTER — Ambulatory Visit: Payer: Medicare Other | Admitting: Physical Therapy

## 2016-04-19 ENCOUNTER — Ambulatory Visit: Payer: Medicare Other | Attending: Geriatric Medicine | Admitting: Physical Therapy

## 2016-04-19 DIAGNOSIS — M546 Pain in thoracic spine: Secondary | ICD-10-CM | POA: Insufficient documentation

## 2016-04-19 DIAGNOSIS — R262 Difficulty in walking, not elsewhere classified: Secondary | ICD-10-CM | POA: Insufficient documentation

## 2016-04-19 DIAGNOSIS — R5381 Other malaise: Secondary | ICD-10-CM | POA: Diagnosis present

## 2016-04-19 NOTE — Therapy (Signed)
[facility] [address] [place], [address] Phone: [phone]   Fax:  [phone]  Physical Therapy Treatment  Patient Details  Name: Katie Larsen MRN: [id number] Date of Birth: 30-Apr-1955 No Data Recorded  Encounter Date: 04/19/2016      PT End of Session - 04/19/16 1510    Visit Number 18   Number of Visits 21   Date for PT Re-Evaluation 05/31/16   PT Start Time 1430   PT Stop Time 1509   PT Time Calculation (min) 39 min   Equipment Utilized During Treatment Oxygen   Activity Tolerance Patient tolerated treatment well;Patient limited by fatigue   Behavior During Therapy [facility] for tasks assessed/performed      Past Medical History:  Diagnosis Date  . COPD (chronic obstructive pulmonary disease) ([place])   . Hypertension   . Renal disorder     Past Surgical History:  Procedure Laterality Date  . AV FISTULA PLACEMENT    . CHOLECYSTECTOMY      There were no vitals filed for this visit.      Subjective Assessment - 04/19/16 1447    Subjective Patient reports she had a good visit with dialysis yesterday, reports she has just been feeling tired today. She has been participating in her walking program.    Pertinent History She is on chronic O2, on dialysis.    Limitations Standing;Walking;Lifting;Sitting   Patient Stated Goals To reduce her pain and become more functional.    Currently in Pain? Other (Comment)        TM - warm up bout x 1.0 mph x 2 minutes on 1.5L of O2, monitored HR in the 80s-90s as it is difficult to pick up O2 sats for patient. Progressed to 1.5 mph x 3 minutes with 1.0 mph x 2 minute "active rest" followed by 1.7 mph for 2 minutes (patient noticeably fatigued after completion). 0.25 miles completed   Circuit for HEP provided and observed by therapist  1 minute sit to stands without hands (added 2# DB) 1 minute rest  1 minute lateral band walks with red t-band 1 minute rest   1 minute step ups (patient asked to have a rest break following this due to fatigue)  1 minute rest    TM at 1.7 mph x 2.5 minutes after completion of final round of step ups and rest break. After this patient again reported increased fatigue, thus session completed.                           PT Education - 04/19/16 1730    Education provided Yes   Education Details Provided interval training circuit, encouraged patient to continue with more high intensity exercises.    Person(s) Educated Patient   Methods Demonstration;Explanation;Handout   Comprehension Verbalized understanding;Returned demonstration             PT Long Term Goals - 04/05/16 1347      PT LONG TERM GOAL #1   Title Patient will complete 27mwalk in under 10 seconds to demonstrate appropriate speed for community mobility.    Baseline 11/7: 12.43  -- on 1/23/ -- 10.25 seconds    Time 8   Period Weeks   Status Partially Met     PT LONG TERM GOAL #2   Title Patient will report worst VAS pain score of less than 4/10 to demonstrate improved tolerance for ADLs.  Baseline 11/7: "it can stll get to a 10; it was a 10/10 3-4 days ago.".Marland Kitchen 12/26 - having sharp pain which she did not rate. -- "it hasn't been hurting too bad.. reports a 6 instead of 8-9 in the past"   Time 8   Period Weeks   Status Partially Met     PT LONG TERM GOAL #3   Title Patient will complete 5x sit to stand in less than 14 seconds to demonstrate appropriate LE strength and power to support torso.    Baseline 11/7: 18.3 sec. On 12/26 - 12.71 seconds.    Time 8   Period Weeks   Status Achieved     PT LONG TERM GOAL #4   Title Patient will report ODI score of less than 20% disability to demonstrate improved tolerance for ADLs.    Baseline (10/3) 26% -- 1/23/-10%   Time 8   Period Weeks   Status Achieved     PT LONG TERM GOAL #5   Title Patient will anmbulate at least 750' in 6 minute walk test to demonstrate improved  tolerance for community ADLs.    Baseline 430' on 11/20. on 12/26 - 700'  -- 700' with 30" left but finished due to fatigue on 04/05/2016   Time 6   Period Weeks   Status Partially Met               Plan - 04/19/16 1511    Clinical Impression Statement Patient reports she has been diligent with her HEP, she has been able to have more energy most days but today is feeling more tired than normal. She is no longer appearing as fatigued with ambulation, and appears more appropriate for a home based high intensity interval program to increase her conditioning, which she was educated on. Will follow up with patient in roughly 1 month to monitor progress.    Rehab Potential Good   Clinical Impairments Affecting Rehab Potential Chronicity of fractures, osteoporosis, sedentary lifestyle.    PT Frequency 1x / week   PT Duration 6 weeks   PT Treatment/Interventions Aquatic Therapy;Biofeedback;Therapeutic exercise;Therapeutic activities;Taping;Manual techniques;Gait training   PT Next Visit Plan Peri-scapular musculature strengthening, gluteal/LE strengthening, postural awareness (can try taping)   PT Home Exercise Plan Fwd and lateral step ups, Weighted sit <> stand with eccentric stand > sit, Resisted lateral walking with GTB   Consulted and Agree with Plan of Care Patient      Patient will benefit from skilled therapeutic intervention in order to improve the following deficits and impairments:  Abnormal gait, Improper body mechanics, Pain, Postural dysfunction, Difficulty walking, Decreased strength  Visit Diagnosis: Pain in thoracic spine  Difficulty in walking, not elsewhere classified  Physical deconditioning     Problem List There are no active problems to display for this patient.  Royce Macadamia PT, DPT, CSCS    04/19/2016, 5:33 PM  [facility] [address], [place], [address] Phone: [phone]    Fax:  [phone]  Name: Katie Larsen MRN: [id number] Date of Birth: Dec 11, 1955

## 2016-04-19 NOTE — Patient Instructions (Signed)
TM  Circuit   TM at 1.7 mph x 2.5 minutes

## 2016-04-26 ENCOUNTER — Encounter: Payer: Medicare Other | Admitting: Physical Therapy

## 2016-05-03 ENCOUNTER — Encounter: Payer: Medicare Other | Admitting: Physical Therapy

## 2016-05-10 ENCOUNTER — Encounter: Payer: Medicare Other | Admitting: Physical Therapy

## 2016-05-17 ENCOUNTER — Ambulatory Visit: Payer: Medicare Other | Attending: Geriatric Medicine | Admitting: Physical Therapy

## 2016-05-17 DIAGNOSIS — R5381 Other malaise: Secondary | ICD-10-CM | POA: Diagnosis present

## 2016-05-17 DIAGNOSIS — R262 Difficulty in walking, not elsewhere classified: Secondary | ICD-10-CM | POA: Insufficient documentation

## 2016-05-17 DIAGNOSIS — M546 Pain in thoracic spine: Secondary | ICD-10-CM

## 2016-05-17 NOTE — Therapy (Signed)
[facility] [address], [place], [address] Phone: [phone]   Fax:  [phone]  Physical Therapy Treatment  Patient Details  Name: Katie Larsen MRN: [id number] Date of Birth: May 14, 1955 No Data Recorded  Encounter Date: 05/17/2016      PT End of Session - 05/17/16 1327    Visit Number 19   Number of Visits 21   Date for PT Re-Evaluation 05/31/16   PT Start Time 1302   PT Stop Time 1345   PT Time Calculation (min) 43 min   Equipment Utilized During Treatment Oxygen   Activity Tolerance Patient tolerated treatment well;Patient limited by fatigue   Behavior During Therapy [facility] for tasks assessed/performed      Past Medical History:  Diagnosis Date  . COPD (chronic obstructive pulmonary disease) ([place])   . Hypertension   . Renal disorder     Past Surgical History:  Procedure Laterality Date  . AV FISTULA PLACEMENT    . CHOLECYSTECTOMY      There were no vitals filed for this visit.      Subjective Assessment - 05/17/16 1309    Subjective Patient reports she had a good dialysis treatment yesterday. She has had an increase in mid back pain. She has had more anxiety recently as some of her medical treatments are not being covered.   Pertinent History She is on chronic O2, on dialysis.    Limitations Standing;Walking;Lifting;Sitting   Patient Stated Goals To reduce her pain and become more functional.    Currently in Pain? Yes  Moderate to severe thoracic pain, this is intermittent, but exacerbated today      6MW test - initially at 94% on 1L of O2, able to walk 500' before taking a break (88% initially on 1.5L, increased to 93% with rest break). 600' total   Side stepping with red band (added yellow band for second bout) x 15 for 2 sets   Standing hip abductions x 15 for 2 sets with red and yellow t-band  Lunges with HHA (had to go halfway down due to LE weakness) x 8 per side (quite  challenging for her)   Provided HEP for her consisting of fast sit to stands, side stepping with red band, standing hip abductions with bands.                            PT Education - 05/17/16 1325    Education provided Yes   Education Details She has made good progress with therapy, would benefit from HEP and has decreased O2 dependence from 2 to 2.5L to 1 to 1.5L now.    Person(s) Educated Patient   Methods Explanation;Handout   Comprehension Verbalized understanding;Returned demonstration             PT Long Term Goals - 04/05/16 1347      PT LONG TERM GOAL #1   Title Patient will complete 68mwalk in under 10 seconds to demonstrate appropriate speed for community mobility.    Baseline 11/7: 12.43  -- on 1/23/ -- 10.25 seconds    Time 8   Period Weeks   Status Partially Met     PT LONG TERM GOAL #2   Title Patient will report worst VAS pain score of less than 4/10 to demonstrate improved tolerance for ADLs.    Baseline 11/7: "it can stll get to a 10; it was a 10/10  3-4 days ago.".Marland Kitchen 12/26 - having sharp pain which she did not rate. -- "it hasn't been hurting too bad.. reports a 6 instead of 8-9 in the past"   Time 8   Period Weeks   Status Partially Met     PT LONG TERM GOAL #3   Title Patient will complete 5x sit to stand in less than 14 seconds to demonstrate appropriate LE strength and power to support torso.    Baseline 11/7: 18.3 sec. On 12/26 - 12.71 seconds.    Time 8   Period Weeks   Status Achieved     PT LONG TERM GOAL #4   Title Patient will report ODI score of less than 20% disability to demonstrate improved tolerance for ADLs.    Baseline (10/3) 26% -- 1/23/-10%   Time 8   Period Weeks   Status Achieved     PT LONG TERM GOAL #5   Title Patient will anmbulate at least 750' in 6 minute walk test to demonstrate improved tolerance for community ADLs.    Baseline 430' on 11/20. on 12/26 - 700'  -- 700' with 30" left but finished due  to fatigue on 04/05/2016   Time 6   Period Weeks   Status Partially Met               Plan - 05/23/16 1327    Clinical Impression Statement Patient reports decreased pain throughout the day, though continues to have good/bad days. She has decreased her O2 from 2-2.5L to 1-1.5L consistently day to day. Her 6MWT has not improved in her past 3 visits, likely a reflection of decreased ability to tolerate faster paced walking, she reports she has been walking consistently, but at slower speeds. Given her progress, will have her return for short check up in roughly 1 month.    Rehab Potential Good   Clinical Impairments Affecting Rehab Potential Chronicity of fractures, osteoporosis, sedentary lifestyle.    PT Frequency 1x / week   PT Duration 6 weeks   PT Treatment/Interventions Aquatic Therapy;Biofeedback;Therapeutic exercise;Therapeutic activities;Taping;Manual techniques;Gait training   PT Next Visit Plan Peri-scapular musculature strengthening, gluteal/LE strengthening, postural awareness (can try taping)   PT Home Exercise Plan Fwd and lateral step ups, Weighted sit <> stand with eccentric stand > sit, Resisted lateral walking with GTB   Consulted and Agree with Plan of Care Patient      Patient will benefit from skilled therapeutic intervention in order to improve the following deficits and impairments:  Abnormal gait, Improper body mechanics, Pain, Postural dysfunction, Difficulty walking, Decreased strength  Visit Diagnosis: Pain in thoracic spine  Difficulty in walking, not elsewhere classified  Physical deconditioning       G-Codes - 05/23/16 1330    Functional Assessment Tool Used (Outpatient Only) 6MW test, 22mwalk   Functional Limitation Mobility: Walking and moving around   Mobility: Walking and Moving Around Current Status ((C1448 At least 1 percent but less than 20 percent impaired, limited or restricted   Mobility: Walking and Moving Around Goal Status (([id number]  At least 1 percent but less than 20 percent impaired, limited or restricted      Problem List There are no active problems to display for this patient.  PRoyce MacadamiaPT, DPT, CSCS    3March 12, 2018 2:46 PM  [facility] [address] [place] [address]Phone: [phone]  Fax:  [phone] Name: LNYKAYLA MARCELLIMRN: [id number]  of Birth: Sep 11, 1955

## 2016-05-17 NOTE — Patient Instructions (Addendum)
 test - initially at 94% on 1L of O2, able to walk 500' before taking a break (88% initially on 1.5L, increased to 93% with rest break). 600' total   Side stepping with red band (added yellow band for second bout) x 15 for 2 sets   Standing hip abductions x 15 for 2 sets with red and yellow t-band  Lunges with HHA (had to go halfway down due to LE weakness)

## 2016-05-27 ENCOUNTER — Encounter: Payer: Self-pay | Admitting: Emergency Medicine

## 2016-05-27 ENCOUNTER — Emergency Department: Payer: Medicare Other

## 2016-05-27 ENCOUNTER — Emergency Department
Admission: EM | Admit: 2016-05-27 | Discharge: 2016-05-27 | Disposition: A | Discharge status: Home / Self Care | Payer: Medicare Other | Attending: Emergency Medicine | Admitting: Emergency Medicine

## 2016-05-27 DIAGNOSIS — R05 Cough: Secondary | ICD-10-CM | POA: Diagnosis not present

## 2016-05-27 DIAGNOSIS — Z79899 Other long term (current) drug therapy: Secondary | ICD-10-CM | POA: Diagnosis not present

## 2016-05-27 DIAGNOSIS — N186 End stage renal disease: Secondary | ICD-10-CM

## 2016-05-27 DIAGNOSIS — I129 Hypertensive chronic kidney disease with stage 1 through stage 4 chronic kidney disease, or unspecified chronic kidney disease: Secondary | ICD-10-CM | POA: Diagnosis not present

## 2016-05-27 DIAGNOSIS — J449 Chronic obstructive pulmonary disease, unspecified: Secondary | ICD-10-CM | POA: Insufficient documentation

## 2016-05-27 DIAGNOSIS — R059 Cough, unspecified: Secondary | ICD-10-CM

## 2016-05-27 DIAGNOSIS — N189 Chronic kidney disease, unspecified: Secondary | ICD-10-CM | POA: Insufficient documentation

## 2016-05-27 LAB — CBC
HCT: 35 % (ref 35.0–47.0)
Hemoglobin: 11.6 g/dL — ABNORMAL LOW (ref 12.0–16.0)
MCH: 30.1 pg (ref 26.0–34.0)
MCHC: 33.1 g/dL (ref 32.0–36.0)
MCV: 90.9 fL (ref 80.0–100.0)
PLATELETS: 157 10*3/uL (ref 150–440)
RBC: 3.85 MIL/uL (ref 3.80–5.20)
RDW: 17.3 % — AB (ref 11.5–14.5)
WBC: 5.7 10*3/uL (ref 3.6–11.0)

## 2016-05-27 LAB — HEPATIC FUNCTION PANEL
ALBUMIN: 4.1 g/dL (ref 3.5–5.0)
ALT: 12 U/L — ABNORMAL LOW (ref 14–54)
AST: 17 U/L (ref 15–41)
Alkaline Phosphatase: 44 U/L (ref 38–126)
BILIRUBIN TOTAL: 0.8 mg/dL (ref 0.3–1.2)
Bilirubin, Direct: 0.1 mg/dL (ref 0.1–0.5)
Indirect Bilirubin: 0.7 mg/dL (ref 0.3–0.9)
Total Protein: 6.6 g/dL (ref 6.5–8.1)

## 2016-05-27 LAB — BASIC METABOLIC PANEL
ANION GAP: 14 (ref 5–15)
BUN: 56 mg/dL — ABNORMAL HIGH (ref 6–20)
CALCIUM: 10.9 mg/dL — AB (ref 8.9–10.3)
CO2: 24 mmol/L (ref 22–32)
Chloride: 97 mmol/L — ABNORMAL LOW (ref 101–111)
Creatinine, Ser: 7.72 mg/dL — ABNORMAL HIGH (ref 0.44–1.00)
GFR, EST AFRICAN AMERICAN: 6 mL/min — AB (ref >60)
GFR, EST NON AFRICAN AMERICAN: 5 mL/min — AB (ref >60)
GLUCOSE: 119 mg/dL — AB (ref 65–99)
Potassium: 4.7 mmol/L (ref 3.5–5.1)
SODIUM: 135 mmol/L (ref 135–145)

## 2016-05-27 LAB — LIPASE, BLOOD: Lipase: <10 U/L — ABNORMAL LOW (ref 11–51)

## 2016-05-27 LAB — TROPONIN I: TROPONIN I: 0.03 ng/mL — AB (ref <0.03)

## 2016-05-27 LAB — PROTIME-INR
INR: 1.32
PROTHROMBIN TIME: 16.5 s — AB (ref 11.4–15.2)

## 2016-05-27 MED ORDER — ACETAMINOPHEN 500 MG PO TABS
1000.0000 mg | ORAL_TABLET | Freq: Once | ORAL | Status: DC
  Filled 2016-05-27: qty 2

## 2016-05-27 MED ORDER — AZITHROMYCIN 250 MG PO TABS: ORAL_TABLET | ORAL | 0 refills | Status: AC

## 2016-05-27 MED ORDER — IBUPROFEN 600 MG PO TABS
600.0000 mg | ORAL_TABLET | Freq: Once | ORAL | Status: DC
  Filled 2016-05-27: qty 1

## 2016-05-27 MED ORDER — NITROGLYCERIN 0.4 MG SL SUBL
0.4000 mg | SUBLINGUAL_TABLET | SUBLINGUAL | Status: DC | PRN
  Administered 2016-05-27: 0.4 mg via SUBLINGUAL
  Filled 2016-05-27: qty 1

## 2016-05-27 MED ORDER — NITROGLYCERIN 0.4 MG/SPRAY TL SOLN
1.0000 | Status: DC
  Filled 2016-05-27: qty 4.9

## 2016-05-27 NOTE — ED Provider Notes (Signed)
[facility] Emergency Department Provider Note  ____________________________________________   None    (approximate)  I have reviewed the triage vital signs and the nursing notes.   HISTORY  Chief Complaint Shortness of Breath    HPI Katie Larsen is a 61 y.o. female who comes to the emergency department with 1 day of cough and shortness of breath. She is end-stage renal disease of unknown etiology and is due for hemodialysis tomorrow.She denies fevers or chills. She sleeps on one pillow and does not wake at night short of breath. She denies leg swelling. She is frustrated by the cough and is concerned that she may have pneumonia.   Past Medical History:  Diagnosis Date  . COPD (chronic obstructive pulmonary disease) ([facility])   . Hypertension   . Renal disorder     There are no active problems to display for this patient.   Past Surgical History:  Procedure Laterality Date  . AV FISTULA PLACEMENT    . CHOLECYSTECTOMY      Prior to Admission medications   Medication Sig Start Date End Date Taking? Authorizing Provider  azithromycin (ZITHROMAX Z-PAK) 250 MG tablet Take 2 tablets (500 mg) on  Day 1,  followed by 1 tablet (250 mg) once daily on Days 2 through 5. 05/27/16 06/01/16  Merrily Brittle, MD  carvedilol (COREG) 25 MG tablet Take 25 mg by mouth 2 (two) times daily with a meal.    Historical Provider, MD  hydrALAZINE (APRESOLINE) 100 MG tablet Take 100 mg by mouth 2 (two) times daily.    Historical Provider, MD  lisinopril (PRINIVIL,ZESTRIL) 20 MG tablet Take 20 mg by mouth daily.    Historical Provider, MD  traMADol (ULTRAM) 50 MG tablet Take 50 mg by mouth every morning.    Historical Provider, MD    Allergies Hydrocodone  No family history on file.  Social History Social History  Substance Use Topics  . Smoking status: Former Games developer  . Smokeless tobacco: Never Used  . Alcohol use No    Review of Systems Constitutional: No  fever/chills Eyes: No visual changes. ENT: No sore throat. Cardiovascular: Denies chest pain. Respiratory: Positive shortness of breath. Gastrointestinal: No abdominal pain.  No nausea, no vomiting.  No diarrhea.  No constipation. Genitourinary: Negative for dysuria. Musculoskeletal: Negative for back pain. Skin: Negative for rash. Neurological: Negative for headaches, focal weakness or numbness.  10-point ROS otherwise negative.  ____________________________________________   PHYSICAL EXAM:  VITAL SIGNS: ED Triage Vitals  Enc Vitals Group     BP 05/27/16 0106 (!) 176/83     Pulse Rate 05/27/16 0058 (!) 54     Resp 05/27/16 0058 16     Temp 05/27/16 0058 98.4 F (36.9 C)     Temp Source 05/27/16 0058 Oral     SpO2 05/27/16 0058 100 %     Weight 05/27/16 0101 136 lb (61.7 kg)     Height 05/27/16 0101 5\' 3"  (1.6 m)     Head Circumference --      Peak Flow --      Pain Score 05/27/16 0101 0     Pain Loc --      Pain Edu? --      Excl. in GC? --     Constitutional: Alert and oriented x 4 well appearing nontoxic no diaphoresis speaks in full, clear sentencesPleasant and cooperative Eyes: PERRL EOMI. Head: Atraumatic. Nose: No congestion/rhinnorhea. Mouth/Throat: No trismus Neck: Able to lie completely flat with  mild jugular venous distention Cardiovascular: Bradycardic rate, regular rhythm. Grossly normal heart sounds.  Good peripheral circulation. Respiratory: Normal respiratory effort.  No retractions. Lungs CTAB and moving good air Gastrointestinal: Soft nondistended nontender no rebound no guarding no peritonitis no McBurney's tenderness negative Rovsing's no costovertebral tenderness negative Murphy's Musculoskeletal: No lower extremity edema  legs are equal in size fistula to left upper extremity with good thrill Neurologic:  Normal speech and language. No gross focal neurologic deficits are appreciated. Skin:  Skin is warm, dry and intact. No rash  noted. Psychiatric: Mood and affect are normal. Speech and behavior are normal.    ____________________________________________   DIFFERENTIAL  Fluid overload, COPD exacerbation, CHF exacerbation, metabolic derangement ____________________________________________   LABS (all labs ordered are listed, but only abnormal results are displayed)  Labs Reviewed  PROTIME-INR - Abnormal; Notable for the following:       Result Value   Prothrombin Time 16.5 (*)    All other components within normal limits  CBC - Abnormal; Notable for the following:    Hemoglobin 11.6 (*)    RDW 17.3 (*)    All other components within normal limits  BASIC METABOLIC PANEL - Abnormal; Notable for the following:    Chloride 97 (*)    Glucose, Bld 119 (*)    BUN 56 (*)    Creatinine, Ser 7.72 (*)    Calcium 10.9 (*)    GFR calc non Af Amer 5 (*)    GFR calc Af Amer 6 (*)    All other components within normal limits  HEPATIC FUNCTION PANEL - Abnormal; Notable for the following:    ALT 12 (*)    All other components within normal limits  LIPASE, BLOOD - Abnormal; Notable for the following:    Lipase <10 (*)    All other components within normal limits  TROPONIN I - Abnormal; Notable for the following:    Troponin I 0.03 (*)    All other components within normal limits  URINALYSIS, COMPLETE (UACMP) WITH MICROSCOPIC    Slightly elevated troponin but with significantly elevated creatinine is not suggestive of myocardial ischemia __________________________________________  EKG  ED ECG REPORT I, Merrily BrittleNeil Royetta Probus, the attending physician, personally viewed and interpreted this ECG.  Date: 05/27/2016 Rate: 68 Normal sinus rhythm with multiple premature atrial complexes mild ST elevation in aVR but none otherwise no signs of acute ischemia abnormal EKG  ____________________________________________  RADIOLOGY  Chest x-ray with no acute  disease ____________________________________________   PROCEDURES  Procedure(s) performed: no  Procedures  Critical Care performed: no  ____________________________________________   INITIAL IMPRESSION / ASSESSMENT AND PLAN / ED COURSE  Pertinent labs & imaging results that were available during my care of the patient were reviewed by me and considered in my medical decision making (see chart for details).     ----------------------------------------- 4:04 AM on 05/27/2016 -----------------------------------------  The patient is very well-appearing and not significantly fluid overloaded. Her cough may represent an early pneumonia so I will treat her with azithromycin. She has dialysis in 2 hours. She is medically stable for outpatient management understands and agrees with the plan.  ____________________________________________   FINAL CLINICAL IMPRESSION(S) / ED DIAGNOSES  Final diagnoses:  Cough  End stage renal disease ([facility])      NEW MEDICATIONS STARTED DURING THIS VISIT:  Discharge Medication List as of 05/27/2016  4:04 AM    START taking these medications   Details  azithromycin (ZITHROMAX Z-PAK) 250 MG tablet Take 2 tablets (500  mg) on  Day 1,  followed by 1 tablet (250 mg) once daily on Days 2 through 5., Print         Note:  This document was prepared using Dragon voice recognition software and may include unintentional dictation errors.     Merrily Brittle, MD 05/27/16 [phone]

## 2016-05-27 NOTE — Discharge Instructions (Signed)
Please make sure you get dialysis today at 6 AM. Take all of your antibiotics as prescribed. Return to the emergency department for any new or worsening symptoms.  It was a pleasure to take care of you today, and thank you for coming to our emergency department.  If you have any questions or concerns before leaving please ask the nurse to grab me and I'm more than happy to go through your aftercare instructions again.  If you were prescribed any opioid pain medication today such as Norco, Vicodin, Percocet, morphine, hydrocodone, or oxycodone please make sure you do not drive when you are taking this medication as it can alter your ability to drive safely.  If you have any concerns once you are home that you are not improving or are in fact getting worse before you can make it to your follow-up appointment, please do not hesitate to call 911 and come back for further evaluation.  Merrily BrittleNeil Pamalee Marcoe MD  Results for orders placed or performed during the hospital encounter of 05/27/16  Protime-INR  Result Value Ref Range   Prothrombin Time 16.5 (H) 11.4 - 15.2 seconds   INR 1.32   CBC  Result Value Ref Range   WBC 5.7 3.6 - 11.0 K/uL   RBC 3.85 3.80 - 5.20 MIL/uL   Hemoglobin 11.6 (L) 12.0 - 16.0 g/dL   HCT 40.935.0 81.135.0 - 91.447.0 %   MCV 90.9 80.0 - 100.0 fL   MCH 30.1 26.0 - 34.0 pg   MCHC 33.1 32.0 - 36.0 g/dL   RDW 78.217.3 (H) 95.611.5 - 21.314.5 %   Platelets 157 150 - 440 K/uL  Basic metabolic panel  Result Value Ref Range   Sodium 135 135 - 145 mmol/L   Potassium 4.7 3.5 - 5.1 mmol/L   Chloride 97 (L) 101 - 111 mmol/L   CO2 24 22 - 32 mmol/L   Glucose, Bld 119 (H) 65 - 99 mg/dL   BUN 56 (H) 6 - 20 mg/dL   Creatinine, Ser 0.867.72 (H) 0.44 - 1.00 mg/dL   Calcium 57.810.9 (H) 8.9 - 10.3 mg/dL   GFR calc non Af Amer 5 (L) >60 mL/min   GFR calc Af Amer 6 (L) >60 mL/min   Anion gap 14 5 - 15  Hepatic function panel  Result Value Ref Range   Total Protein 6.6 6.5 - 8.1 g/dL   Albumin 4.1 3.5 - 5.0 g/dL   AST 17 15 - 41 U/L   ALT 12 (L) 14 - 54 U/L   Alkaline Phosphatase 44 38 - 126 U/L   Total Bilirubin 0.8 0.3 - 1.2 mg/dL   Bilirubin, Direct 0.1 0.1 - 0.5 mg/dL   Indirect Bilirubin 0.7 0.3 - 0.9 mg/dL  Lipase, blood  Result Value Ref Range   Lipase <10 (L) 11 - 51 U/L   Dg Chest 2 View  Result Date: 05/27/2016 CLINICAL DATA:  Shortness of breath, cough, and nausea for 2 days. COPD. Hypertension. Former smoker. EXAM: CHEST  2 VIEW COMPARISON:  06/24/2014 FINDINGS: Cardiac enlargement without significant pulmonary vascular congestion. Small right pleural effusion is improving since previous study there remains present. Infiltration or atelectasis in the right lung base is unchanged. No focal consolidation on the left. No pneumothorax. Tortuous aorta. Emphysematous changes in the lungs. Compression of mid thoracic vertebrae without change. IMPRESSION: Improving small right pleural effusion. Persistent infiltration or atelectasis in the right lung base. Electronically Signed   By: Burman NievesWilliam  Stevens M.D.   On: 05/27/2016  01:35  ° ° °

## 2016-05-27 NOTE — ED Notes (Signed)
Pyxis out of nitro, pharm notified, will give when tubed down

## 2016-05-27 NOTE — ED Triage Notes (Signed)
Pt to ED via EMS from home with c/o SOB and nausea. Per EMS pt 95% on 2L , 88 HR  200/100 BP. Pt is M-W-F dialysis pt. Pt A&Ox4. Pt in NAD at thsi time

## 2016-06-14 ENCOUNTER — Ambulatory Visit: Payer: Medicare Other | Admitting: Physical Therapy

## 2017-02-20 ENCOUNTER — Emergency Department
Admission: EM | Admit: 2017-02-20 | Discharge: 2017-03-14 | Disposition: E | Payer: Medicare Other | Attending: Emergency Medicine | Admitting: Emergency Medicine

## 2017-02-20 DIAGNOSIS — I469 Cardiac arrest, cause unspecified: Secondary | ICD-10-CM | POA: Insufficient documentation

## 2017-02-20 DIAGNOSIS — Z87891 Personal history of nicotine dependence: Secondary | ICD-10-CM | POA: Diagnosis not present

## 2017-02-20 DIAGNOSIS — J449 Chronic obstructive pulmonary disease, unspecified: Secondary | ICD-10-CM | POA: Insufficient documentation

## 2017-02-20 DIAGNOSIS — Z992 Dependence on renal dialysis: Secondary | ICD-10-CM | POA: Insufficient documentation

## 2017-02-20 DIAGNOSIS — I10 Essential (primary) hypertension: Secondary | ICD-10-CM | POA: Insufficient documentation

## 2017-02-20 LAB — GLUCOSE, CAPILLARY: GLUCOSE-CAPILLARY: 106 mg/dL — AB (ref 65–99)

## 2017-02-20 MED ORDER — EPINEPHRINE PF 1 MG/10ML IJ SOSY
PREFILLED_SYRINGE | INTRAMUSCULAR | Status: AC | PRN
  Administered 2017-02-20: 0.1 mg via INTRAVENOUS

## 2017-03-14 NOTE — ED Provider Notes (Addendum)
[facility] Department of Emergency Medicine   HPI  Chief Complaint: Cardiac arrest/unresponsive   Level V Caveat: Unresponsive  History of present illness:  Patient presents in sudden onset cardiac arrest.  History is that the patient was evidently getting ready to go to dialysis, was standing or walking to the car when she suddenly passed out.  He was in cardiac arrest on fire department and EMS arrival.  EMS reports CPR was started approximately 35-40 minutes ago  ROS: Unable to obtain, Level V caveat  Scheduled Meds: Continuous Infusions: PRN Meds:. Past Medical History:  Diagnosis Date  . COPD (chronic obstructive pulmonary disease) (HCC)   . Hypertension   . Renal disorder    Past Surgical History:  Procedure Laterality Date  . AV FISTULA PLACEMENT    . CHOLECYSTECTOMY     Social History   Socioeconomic History  . Marital status: Single    Spouse name: Not on file  . Number of children: Not on file  . Years of education: Not on file  . Highest education level: Not on file  Social Needs  . Financial resource strain: Not on file  . Food insecurity - worry: Not on file  . Food insecurity - inability: Not on file  . Transportation needs - medical: Not on file  . Transportation needs - non-medical: Not on file  Occupational History  . Not on file  Tobacco Use  . Smoking status: Former Games developermoker  . Smokeless tobacco: Never Used  Substance and Sexual Activity  . Alcohol use: No  . Drug use: No  . Sexual activity: Not on file  Other Topics Concern  . Not on file  Social History Narrative  . Not on file   Allergies  Allergen Reactions  . Hydrocodone Itching    Last set of Vital Signs (not current) Vitals:      Physical Exam  Gen: unresponsive Cardiovascular: pulseless  Resp: apneic. Breath sounds equal bilaterally with bagging  Abd: nondistended  Neuro: GCS 3, unresponsive to pain, pupils are fixed and dilated without corneal  reflex HEENT: No blood in posterior pharynx, gag reflex absent there is some mild edema noted of the tongue and epiglottic folds Neck: No crepitus  Musculoskeletal: No deformity  Skin: Cool no rigor mortis  Procedures  INTUBATION Performed by: Sharyn CreamerQUALE, MARK Required items: required blood products, implants, devices, and special equipment available Patient identity confirmed: provided demographic data and hospital-assigned identification number Time out: Immediately prior to procedure a "time out" was called to verify the correct patient, procedure, equipment, support staff and site/side marked as required. Indications: Cardiac arrest Intubation method: glidescope Preoxygenation: BVM Sedatives:  Paralytic:  Tube Size: 7.0 cuffed Post-procedure assessment: chest rise and ETCO2 monitor Breath sounds: equal and absent over the epigastrium Tube secured by Respiratory Therapy Patient tolerated the procedure well with no immediate complications.  CRITICAL CARE Performed by: Sharyn CreamerQUALE, MARK Total critical care time: 35 Critical care time was exclusive of separately billable procedures and treating other patients. Critical care was necessary to treat or prevent imminent or life-threatening deterioration. Critical care was time spent personally by me on the following activities: development of treatment plan with surrogate as well as nursing, evaluation of patient's response to treatment, examination of patient, obtaining history from patient or surrogate and EMS, ordering and performing treatments and interventions, ordering and review of laboratory studies, discussing care and goals with family (including patient mother), pulse oximetry and re-evaluation of patient's condition.  Cardiopulmonary Resuscitation (CPR)  Procedure Note  Directed/Performed by: Sharyn CreamerQUALE, MARK I personally directed ancillary staff and/or performed CPR in an effort to regain return of spontaneous circulation and to maintain  cardiac, neuro and systemic perfusion.    Medical Decision making  Patient presents with reported witnessed cardiac arrest.  EMS reports starting CPR about 40 minutes prior to arrival with 3 doses of epinephrine, calcium chloride for treatment in case of the elevated potassium, and the patient has had ongoing CPR with bag valve mask ventilation with asystole throughout.  Never a shockable rhythm or pulse.  Assessment and Plan  The patient has had cardiac arrest now for approximately 40 minutes with no return of circulation or any evidence of a shockable rhythm.  Her blood sugars been checked and is normal.  She has had treatment for hyperkalemia including calcium chloride, and ongoing ACLS/CPR.  Patient was given additional epinephrine, had her airway secured with good end-tidal waveform, and additional rounds of CPR here with no response.  No shockable rhythm ever resulting in the patient remained after a total of additional about 10 minutes of CPR in the emergency department, decision was made that further attempts at resuscitation appeared futile.  The patient remains in asystole, no signs of life.  Declared deceased by myself.  Ultrasound performed of the heart with no evidence of perfusion or movement.  Patient in asystole, apneic, pulseless, no heart tones or respiratory tones at the time of death declaration.  Fixed dilated pupils without reflexes.  The patient's mother has been notified via myself, she is understanding of the death of the patient.  [facility].    Sharyn CreamerQuale, Mark, MD Sep 18, 2016 1036    Sharyn CreamerQuale, Mark, MD Sep 18, 2016 1041  Impression: Sudden Cardiac Arrest Asystole    Sharyn CreamerQuale, Mark, MD 02/22/17 2057

## 2017-03-14 NOTE — Progress Notes (Signed)
CH responded to page to come be with the mother of the patient. CH provided emotional and grief support. CH offered calming presence as patient's mother spoke about her life. CH provided needed information to staff.

## 2017-03-14 NOTE — ED Triage Notes (Signed)
Per ems pt was waiting to get in a car to go to dialysis and fell to the ground. Pt was unconscious at scene and was asystolic when ems arrived and when pt arrived at er. Ems gave 3 epi's and 1 cal cloride.

## 2017-03-14 NOTE — ED Notes (Signed)
Pt arrived via ems with cpr in progress. BVM in use. Per ems pt down since [phone]

## 2017-03-14 DEATH — deceased
